# Patient Record
Sex: Male | Born: 1970
Health system: Southern US, Community
[De-identification: ages and names within clinical notes are randomized; demographics above are authoritative.]

## PROBLEM LIST (undated history)

## (undated) DIAGNOSIS — R9431 Abnormal electrocardiogram [ECG] [EKG]: Secondary | ICD-10-CM

## (undated) HISTORY — PX: WISDOM TOOTH EXTRACTION: SHX21

## (undated) HISTORY — PX: HEMORROIDECTOMY: SUR656

---

## 2003-05-15 ENCOUNTER — Emergency Department (HOSPITAL_COMMUNITY): Admission: EM | Admit: 2003-05-15 | Discharge: 2003-05-15 | Payer: Self-pay | Admitting: *Deleted

## 2003-05-26 ENCOUNTER — Observation Stay (HOSPITAL_COMMUNITY): Admission: RE | Admit: 2003-05-26 | Discharge: 2003-05-27 | Payer: Self-pay | Admitting: General Surgery

## 2020-01-09 ENCOUNTER — Emergency Department (HOSPITAL_COMMUNITY): Payer: No Typology Code available for payment source

## 2020-01-09 ENCOUNTER — Inpatient Hospital Stay (HOSPITAL_COMMUNITY)
Admission: EM | Admit: 2020-01-09 | Discharge: 2020-01-14 | DRG: 493 | Disposition: A | Discharge status: Home / Self Care | Payer: No Typology Code available for payment source | Attending: Orthopedic Surgery | Admitting: Orthopedic Surgery

## 2020-01-09 ENCOUNTER — Other Ambulatory Visit: Payer: Self-pay

## 2020-01-09 ENCOUNTER — Encounter (HOSPITAL_COMMUNITY): Payer: Self-pay

## 2020-01-09 DIAGNOSIS — Y92410 Unspecified street and highway as the place of occurrence of the external cause: Secondary | ICD-10-CM

## 2020-01-09 DIAGNOSIS — E8889 Other specified metabolic disorders: Secondary | ICD-10-CM | POA: Diagnosis present

## 2020-01-09 DIAGNOSIS — R9431 Abnormal electrocardiogram [ECG] [EKG]: Secondary | ICD-10-CM | POA: Diagnosis present

## 2020-01-09 DIAGNOSIS — D62 Acute posthemorrhagic anemia: Secondary | ICD-10-CM | POA: Diagnosis not present

## 2020-01-09 DIAGNOSIS — E559 Vitamin D deficiency, unspecified: Secondary | ICD-10-CM | POA: Diagnosis present

## 2020-01-09 DIAGNOSIS — Z833 Family history of diabetes mellitus: Secondary | ICD-10-CM | POA: Diagnosis not present

## 2020-01-09 DIAGNOSIS — Z87891 Personal history of nicotine dependence: Secondary | ICD-10-CM

## 2020-01-09 DIAGNOSIS — S93432A Sprain of tibiofibular ligament of left ankle, initial encounter: Secondary | ICD-10-CM | POA: Diagnosis present

## 2020-01-09 DIAGNOSIS — Z20822 Contact with and (suspected) exposure to covid-19: Secondary | ICD-10-CM | POA: Diagnosis present

## 2020-01-09 DIAGNOSIS — R200 Anesthesia of skin: Secondary | ICD-10-CM | POA: Diagnosis present

## 2020-01-09 DIAGNOSIS — S82872A Displaced pilon fracture of left tibia, initial encounter for closed fracture: Secondary | ICD-10-CM | POA: Diagnosis present

## 2020-01-09 DIAGNOSIS — T148XXA Other injury of unspecified body region, initial encounter: Secondary | ICD-10-CM

## 2020-01-09 DIAGNOSIS — Z419 Encounter for procedure for purposes other than remedying health state, unspecified: Secondary | ICD-10-CM

## 2020-01-09 LAB — BASIC METABOLIC PANEL
Anion gap: 8 (ref 5–15)
BUN: 15 mg/dL (ref 6–20)
CO2: 24 mmol/L (ref 22–32)
Calcium: 9 mg/dL (ref 8.9–10.3)
Chloride: 106 mmol/L (ref 98–111)
Creatinine, Ser: 0.96 mg/dL (ref 0.61–1.24)
GFR calc Af Amer: >60 mL/min (ref >60)
GFR calc non Af Amer: >60 mL/min (ref >60)
Glucose, Bld: 130 mg/dL — ABNORMAL HIGH (ref 70–99)
Potassium: 3.9 mmol/L (ref 3.5–5.1)
Sodium: 138 mmol/L (ref 135–145)

## 2020-01-09 LAB — CBC
HCT: 45.7 % (ref 39.0–52.0)
Hemoglobin: 15.6 g/dL (ref 13.0–17.0)
MCH: 30.2 pg (ref 26.0–34.0)
MCHC: 34.1 g/dL (ref 30.0–36.0)
MCV: 88.6 fL (ref 80.0–100.0)
Platelets: 249 10*3/uL (ref 150–400)
RBC: 5.16 MIL/uL (ref 4.22–5.81)
RDW: 13.2 % (ref 11.5–15.5)
WBC: 19.5 10*3/uL — ABNORMAL HIGH (ref 4.0–10.5)
nRBC: 0 % (ref 0.0–0.2)

## 2020-01-09 LAB — RESPIRATORY PANEL BY RT PCR (FLU A&B, COVID)
Influenza A by PCR: NEGATIVE
Influenza B by PCR: NEGATIVE
SARS Coronavirus 2 by RT PCR: NEGATIVE

## 2020-01-09 LAB — TROPONIN I (HIGH SENSITIVITY)
Troponin I (High Sensitivity): <2 ng/L (ref <18)
Troponin I (High Sensitivity): <2 ng/L (ref <18)

## 2020-01-09 MED ORDER — DOCUSATE SODIUM 100 MG PO CAPS
100.0000 mg | ORAL_CAPSULE | Freq: Two times a day (BID) | ORAL | Status: DC
  Administered 2020-01-10 – 2020-01-14 (×9): 100 mg via ORAL
  Filled 2020-01-09 (×9): qty 1

## 2020-01-09 MED ORDER — FENTANYL CITRATE (PF) 100 MCG/2ML IJ SOLN
100.0000 ug | INTRAMUSCULAR | Status: DC | PRN
  Administered 2020-01-09 (×2): 100 ug via INTRAVENOUS
  Administered 2020-01-10: 10:00:00 50 ug via INTRAVENOUS
  Administered 2020-01-10: 100 ug via INTRAVENOUS
  Administered 2020-01-10 (×3): 50 ug via INTRAVENOUS
  Filled 2020-01-09 (×3): qty 2

## 2020-01-09 MED ORDER — ASCORBIC ACID 500 MG PO TABS
1000.0000 mg | ORAL_TABLET | Freq: Every day | ORAL | Status: DC
  Administered 2020-01-10 – 2020-01-14 (×5): 1000 mg via ORAL
  Filled 2020-01-09 (×5): qty 2

## 2020-01-09 MED ORDER — HYDROMORPHONE HCL 1 MG/ML IJ SOLN: 0.5000 mg | INTRAMUSCULAR | Status: DC | PRN

## 2020-01-09 MED ORDER — POLYETHYLENE GLYCOL 3350 17 G PO PACK
17.0000 g | PACK | Freq: Every day | ORAL | Status: DC
  Administered 2020-01-11 – 2020-01-13 (×2): 17 g via ORAL
  Filled 2020-01-09 (×3): qty 1

## 2020-01-09 MED ORDER — OXYCODONE-ACETAMINOPHEN 7.5-325 MG PO TABS
1.0000 | ORAL_TABLET | Freq: Four times a day (QID) | ORAL | Status: DC | PRN
  Administered 2020-01-10 – 2020-01-14 (×11): 2 via ORAL
  Filled 2020-01-09 (×13): qty 2

## 2020-01-09 MED ORDER — GABAPENTIN 300 MG PO CAPS
300.0000 mg | ORAL_CAPSULE | Freq: Two times a day (BID) | ORAL | Status: DC
  Administered 2020-01-10 (×2): 300 mg via ORAL
  Filled 2020-01-09 (×3): qty 1

## 2020-01-09 MED ORDER — CEFAZOLIN SODIUM-DEXTROSE 2-4 GM/100ML-% IV SOLN
2.0000 g | INTRAVENOUS | Status: AC
  Filled 2020-01-09: qty 100

## 2020-01-09 MED ORDER — LACTATED RINGERS IV SOLN: INTRAVENOUS | Status: DC

## 2020-01-09 MED ORDER — ACETAMINOPHEN 500 MG PO TABS
500.0000 mg | ORAL_TABLET | Freq: Two times a day (BID) | ORAL | Status: DC
  Administered 2020-01-10 – 2020-01-14 (×8): 500 mg via ORAL
  Filled 2020-01-09 (×9): qty 1

## 2020-01-09 MED ORDER — METHOCARBAMOL 500 MG PO TABS
750.0000 mg | ORAL_TABLET | Freq: Three times a day (TID) | ORAL | Status: DC | PRN
  Administered 2020-01-11 – 2020-01-14 (×5): 750 mg via ORAL
  Filled 2020-01-09 (×5): qty 2
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
## 2020-01-10 ENCOUNTER — Inpatient Hospital Stay (HOSPITAL_COMMUNITY): Payer: No Typology Code available for payment source

## 2020-01-10 ENCOUNTER — Encounter (HOSPITAL_COMMUNITY)
Admission: EM | Disposition: A | Discharge status: Home / Self Care | Payer: Self-pay | Source: Home / Self Care | Attending: Orthopedic Surgery

## 2020-01-10 ENCOUNTER — Inpatient Hospital Stay (HOSPITAL_COMMUNITY): Payer: No Typology Code available for payment source | Admitting: Anesthesiology

## 2020-01-10 HISTORY — PX: EXTERNAL FIXATION LEG: SHX1549

## 2020-01-10 LAB — SURGICAL PCR SCREEN
MRSA, PCR: NEGATIVE
Staphylococcus aureus: NEGATIVE

## 2020-01-10 LAB — VITAMIN D 25 HYDROXY (VIT D DEFICIENCY, FRACTURES): Vit D, 25-Hydroxy: 29.81 ng/mL — ABNORMAL LOW (ref 30–100)

## 2020-01-10 SURGERY — EXTERNAL FIXATION, LOWER EXTREMITY
Anesthesia: General | Site: Ankle | Laterality: Left

## 2020-01-10 MED ORDER — ONDANSETRON HCL 4 MG/2ML IJ SOLN
INTRAMUSCULAR | Status: DC | PRN
  Administered 2020-01-10: 4 mg via INTRAVENOUS

## 2020-01-10 MED ORDER — ROCURONIUM BROMIDE 50 MG/5ML IV SOSY
PREFILLED_SYRINGE | INTRAVENOUS | Status: DC | PRN
  Administered 2020-01-10: 30 mg via INTRAVENOUS

## 2020-01-10 MED ORDER — PROPOFOL 10 MG/ML IV BOLUS
INTRAVENOUS | Status: AC
  Filled 2020-01-10: qty 20

## 2020-01-10 MED ORDER — OXYCODONE HCL 5 MG/5ML PO SOLN: 5.0000 mg | Freq: Once | ORAL | Status: DC | PRN

## 2020-01-10 MED ORDER — PROPOFOL 10 MG/ML IV BOLUS
INTRAVENOUS | Status: DC | PRN
  Administered 2020-01-10: 20 mg via INTRAVENOUS
  Administered 2020-01-10: 200 mg via INTRAVENOUS

## 2020-01-10 MED ORDER — FENTANYL CITRATE (PF) 100 MCG/2ML IJ SOLN
INTRAMUSCULAR | Status: AC
  Filled 2020-01-10: qty 2

## 2020-01-10 MED ORDER — OXYCODONE HCL 5 MG PO TABS: 5.0000 mg | ORAL_TABLET | Freq: Once | ORAL | Status: DC | PRN

## 2020-01-10 MED ORDER — PROMETHAZINE HCL 25 MG/ML IJ SOLN: 6.2500 mg | INTRAMUSCULAR | Status: DC | PRN

## 2020-01-10 MED ORDER — PHENYLEPHRINE 40 MCG/ML (10ML) SYRINGE FOR IV PUSH (FOR BLOOD PRESSURE SUPPORT)
PREFILLED_SYRINGE | INTRAVENOUS | Status: AC
  Filled 2020-01-10: qty 10

## 2020-01-10 MED ORDER — 0.9 % SODIUM CHLORIDE (POUR BTL) OPTIME
TOPICAL | Status: DC | PRN
  Administered 2020-01-10: 1000 mL

## 2020-01-10 MED ORDER — FENTANYL CITRATE (PF) 250 MCG/5ML IJ SOLN
INTRAMUSCULAR | Status: AC
  Filled 2020-01-10: qty 5

## 2020-01-10 MED ORDER — MIDAZOLAM HCL 5 MG/5ML IJ SOLN
INTRAMUSCULAR | Status: DC | PRN
  Administered 2020-01-10: 2 mg via INTRAVENOUS

## 2020-01-10 MED ORDER — MIDAZOLAM HCL 2 MG/2ML IJ SOLN
INTRAMUSCULAR | Status: AC
  Filled 2020-01-10: qty 2

## 2020-01-10 MED ORDER — SUGAMMADEX SODIUM 200 MG/2ML IV SOLN
INTRAVENOUS | Status: DC | PRN
  Administered 2020-01-10: 200 mg via INTRAVENOUS

## 2020-01-10 MED ORDER — LACTATED RINGERS IV SOLN: INTRAVENOUS | Status: DC

## 2020-01-10 MED ORDER — FENTANYL CITRATE (PF) 100 MCG/2ML IJ SOLN
25.0000 ug | INTRAMUSCULAR | Status: DC | PRN
  Administered 2020-01-10 (×2): 50 ug via INTRAVENOUS

## 2020-01-10 MED ORDER — DEXAMETHASONE SODIUM PHOSPHATE 10 MG/ML IJ SOLN
INTRAMUSCULAR | Status: DC | PRN
  Administered 2020-01-10: 10 mg via INTRAVENOUS

## 2020-01-10 MED ORDER — CEFAZOLIN SODIUM-DEXTROSE 2-3 GM-%(50ML) IV SOLR
INTRAVENOUS | Status: DC | PRN
  Administered 2020-01-10: 2 g via INTRAVENOUS

## 2020-01-10 MED ORDER — LIDOCAINE 2% (20 MG/ML) 5 ML SYRINGE
INTRAMUSCULAR | Status: DC | PRN
  Administered 2020-01-10: 60 mg via INTRAVENOUS

## 2020-01-10 MED ORDER — HYDROMORPHONE HCL 1 MG/ML IJ SOLN
0.5000 mg | INTRAMUSCULAR | Status: DC | PRN
  Administered 2020-01-10 – 2020-01-13 (×5): 1 mg via INTRAVENOUS
  Filled 2020-01-10 (×5): qty 1

## 2020-01-10 SURGICAL SUPPLY — 58 items
BANDAGE ESMARK 6X9 LF (GAUZE/BANDAGES/DRESSINGS) ×1 IMPLANT
BAR GLASS FIBER EXFX 11X150 (EXFIX) ×6 IMPLANT
BAR GLASS FIBER EXFX 11X350 (EXFIX) ×6 IMPLANT
BNDG COHESIVE 6X5 TAN STRL LF (GAUZE/BANDAGES/DRESSINGS) IMPLANT
BNDG ELASTIC 2X5.8 VLCR STR LF (GAUZE/BANDAGES/DRESSINGS) ×3 IMPLANT
BNDG ELASTIC 3X5.8 VLCR STR LF (GAUZE/BANDAGES/DRESSINGS) ×3 IMPLANT
BNDG ELASTIC 4X5.8 VLCR STR LF (GAUZE/BANDAGES/DRESSINGS) ×3 IMPLANT
BNDG ELASTIC 6X5.8 VLCR STR LF (GAUZE/BANDAGES/DRESSINGS) ×3 IMPLANT
BNDG ESMARK 6X9 LF (GAUZE/BANDAGES/DRESSINGS) ×3
BNDG GAUZE ELAST 4 BULKY (GAUZE/BANDAGES/DRESSINGS) ×6 IMPLANT
BRUSH SCRUB EZ PLAIN DRY (MISCELLANEOUS) ×9 IMPLANT
CAP PROTECTIVE TRANSFX 4.5X5MM (EXFIX) ×6 IMPLANT
CLAMP BLUE BAR TO PIN (EXFIX) ×6 IMPLANT
CLOSURE WOUND 1/2 X4 (GAUZE/BANDAGES/DRESSINGS)
COVER SURGICAL LIGHT HANDLE (MISCELLANEOUS) ×6 IMPLANT
COVER WAND RF STERILE (DRAPES) ×3 IMPLANT
CUFF TOURN SGL QUICK 18X4 (TOURNIQUET CUFF) IMPLANT
DRAPE C-ARM 42X72 X-RAY (DRAPES) IMPLANT
DRAPE C-ARMOR (DRAPES) ×3 IMPLANT
DRAPE U-SHAPE 47X51 STRL (DRAPES) ×3 IMPLANT
DRSG ADAPTIC 3X8 NADH LF (GAUZE/BANDAGES/DRESSINGS) ×3 IMPLANT
DRSG MEPITEL 4X7.2 (GAUZE/BANDAGES/DRESSINGS) IMPLANT
ELECT REM PT RETURN 9FT ADLT (ELECTROSURGICAL) ×3
ELECTRODE REM PT RTRN 9FT ADLT (ELECTROSURGICAL) ×1 IMPLANT
EVACUATOR 1/8 PVC DRAIN (DRAIN) IMPLANT
GAUZE SPONGE 4X4 12PLY STRL (GAUZE/BANDAGES/DRESSINGS) ×3 IMPLANT
GLOVE BIO SURGEON STRL SZ7.5 (GLOVE) ×3 IMPLANT
GLOVE BIO SURGEON STRL SZ8 (GLOVE) ×3 IMPLANT
GLOVE BIOGEL PI IND STRL 7.0 (GLOVE) ×2 IMPLANT
GLOVE BIOGEL PI IND STRL 7.5 (GLOVE) ×1 IMPLANT
GLOVE BIOGEL PI IND STRL 8 (GLOVE) ×1 IMPLANT
GLOVE BIOGEL PI INDICATOR 7.0 (GLOVE) ×4
GLOVE BIOGEL PI INDICATOR 7.5 (GLOVE) ×2
GLOVE BIOGEL PI INDICATOR 8 (GLOVE) ×2
GOWN STRL REUS W/ TWL LRG LVL3 (GOWN DISPOSABLE) ×2 IMPLANT
GOWN STRL REUS W/ TWL XL LVL3 (GOWN DISPOSABLE) ×1 IMPLANT
GOWN STRL REUS W/TWL LRG LVL3 (GOWN DISPOSABLE) ×4
GOWN STRL REUS W/TWL XL LVL3 (GOWN DISPOSABLE) ×2
HALF PIN 3MM (EXFIX) ×3 IMPLANT
KIT BASIN OR (CUSTOM PROCEDURE TRAY) ×3 IMPLANT
KIT TURNOVER KIT B (KITS) ×3 IMPLANT
MANIFOLD NEPTUNE II (INSTRUMENTS) IMPLANT
NEEDLE 22X1 1/2 (OR ONLY) (NEEDLE) IMPLANT
NS IRRIG 1000ML POUR BTL (IV SOLUTION) ×3 IMPLANT
PACK ORTHO EXTREMITY (CUSTOM PROCEDURE TRAY) ×3 IMPLANT
PAD ARMBOARD 7.5X6 YLW CONV (MISCELLANEOUS) ×6 IMPLANT
PADDING CAST COTTON 6X4 STRL (CAST SUPPLIES) ×6 IMPLANT
PIN 3MM (EXFIX) ×3 IMPLANT
PIN CLAMP 2BAR 75MM BLUE (EXFIX) ×3 IMPLANT
PIN HALF YELLOW 5X160X35 (EXFIX) ×6 IMPLANT
PIN TRANSFIXING 5.0 (EXFIX) ×3 IMPLANT
SPONGE LAP 18X18 RF (DISPOSABLE) ×3 IMPLANT
STAPLER VISISTAT 35W (STAPLE) IMPLANT
STOCKINETTE IMPERVIOUS LG (DRAPES) IMPLANT
STRIP CLOSURE SKIN 1/2X4 (GAUZE/BANDAGES/DRESSINGS) IMPLANT
TOWEL GREEN STERILE (TOWEL DISPOSABLE) ×6 IMPLANT
TOWEL GREEN STERILE FF (TOWEL DISPOSABLE) ×6 IMPLANT
UNDERPAD 30X30 (UNDERPADS AND DIAPERS) ×3 IMPLANT

## 2020-01-10 NOTE — Progress Notes (Signed)
PT Cancellation Note  Patient Details Name: STANLEE ROEHRIG MRN: [id number] DOB: 1971/03/08   Cancelled Treatment:    Reason Eval/Treat Not Completed: Patient at procedure or test/unavailable (plan for ex fix).    Lillia Pauls, PT, DPT Acute Rehabilitation Services Pager [phone] Office [phone]    [name] 01/10/2020, 8:19 AM

## 2020-01-10 NOTE — Transfer of Care (Signed)
Immediate Anesthesia Transfer of Care Note  Patient: Priscille Kluver  Procedure(s) Performed: EXTERNAL FIXATION, ANKLE (Left Ankle)  Patient Location: PACU  Anesthesia Type:General  Level of Consciousness: awake, alert  and sedated  Airway & Oxygen Therapy: Patient connected to face mask oxygen  Post-op Assessment: Post -op Vital signs reviewed and stable  Post vital signs: stable  Last Vitals:  Vitals Value Taken Time  BP 130/84 01/10/20 1103  Temp    Pulse 80 01/10/20 1103  Resp 11 01/10/20 1103  SpO2 98 % 01/10/20 1103  Vitals shown include unvalidated device data.  Last Pain:  Vitals:   01/10/20 0803  TempSrc: Oral  PainSc:       Patients Stated Pain Goal: 2 (01/10/20 0130)  Complications: No apparent anesthesia complications

## 2020-01-10 NOTE — Anesthesia Procedure Notes (Signed)
Procedure Name: LMA Insertion Date/Time: 01/10/2020 9:45 AM Performed by: Dorie Rank, CRNA Pre-anesthesia Checklist: Patient identified, Emergency Drugs available, Suction available, Patient being monitored and Timeout performed Patient Re-evaluated:Patient Re-evaluated prior to induction Oxygen Delivery Method: Circle system utilized Preoxygenation: Pre-oxygenation with 100% oxygen Induction Type: IV induction Ventilation: Mask ventilation without difficulty LMA: LMA inserted LMA Size: 4.0 Number of attempts: 1 Placement Confirmation: breath sounds checked- equal and bilateral and positive ETCO2 Tube secured with: Tape Dental Injury: Teeth and Oropharynx as per pre-operative assessment

## 2020-01-10 NOTE — Progress Notes (Signed)
OT Cancellation Note  Patient Details Name: Robert Hogan MRN: [id number] DOB: 1971-07-11   Cancelled Treatment:    Reason Eval/Treat Not Completed: Patient at procedure or test/ unavailable; noted plans to OR for ex-fix today. Will follow up for OT eval as able.  Marcy Siren, OT Acute Rehabilitation Services Pager [phone] Office [phone]   Orlando Penner 01/10/2020, 8:20 AM

## 2020-01-10 NOTE — Anesthesia Postprocedure Evaluation (Signed)
Anesthesia Post Note  Patient: Robert Hogan  Procedure(s) Performed: EXTERNAL FIXATION, ANKLE (Left Ankle)     Patient location during evaluation: PACU Anesthesia Type: General Level of consciousness: awake and alert Pain management: pain level controlled Vital Signs Assessment: post-procedure vital signs reviewed and stable Respiratory status: spontaneous breathing, nonlabored ventilation and respiratory function stable Cardiovascular status: blood pressure returned to baseline and stable Postop Assessment: no apparent nausea or vomiting Anesthetic complications: no    Last Vitals:  Vitals:   01/10/20 1130 01/10/20 1155  BP: 127/80 (!) 150/98  Pulse: 78 86  Resp: (!) 8 18  Temp: 36.7 C 36.9 C  SpO2: 100% 100%    Last Pain:  Vitals:   01/10/20 1155  TempSrc: Oral  PainSc:                  Beryle Lathe
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
## 2020-01-10 NOTE — Anesthesia Preprocedure Evaluation (Addendum)
Anesthesia Evaluation  Patient identified by MRN, date of birth, ID band Patient awake    Reviewed: Allergy & Precautions, NPO status , Patient's Chart, lab work & pertinent test results  History of Anesthesia Complications Negative for: history of anesthetic complications  Airway Mallampati: III  TM Distance: >3 FB Neck ROM: Full    Dental  (+) Dental Advisory Given, Teeth Intact   Pulmonary former smoker,    Pulmonary exam normal        Cardiovascular Normal cardiovascular exam   EKG - SR, LAFB, prolonged QTc    Neuro/Psych negative neurological ROS  negative psych ROS   GI/Hepatic negative GI ROS, Neg liver ROS,   Endo/Other  negative endocrine ROS  Renal/GU negative Renal ROS     Musculoskeletal negative musculoskeletal ROS (+)   Abdominal   Peds  Hematology negative hematology ROS (+)   Anesthesia Other Findings   Reproductive/Obstetrics                           Anesthesia Physical Anesthesia Plan  ASA: I  Anesthesia Plan: General   Post-op Pain Management:    Induction: Intravenous  PONV Risk Score and Plan: 3 and Treatment may vary due to age or medical condition, Ondansetron, Midazolam and Dexamethasone  Airway Management Planned: LMA  Additional Equipment: None  Intra-op Plan:   Post-operative Plan: Extubation in OR  Informed Consent: I have reviewed the patients History and Physical, chart, labs and discussed the procedure including the risks, benefits and alternatives for the proposed anesthesia with the patient or authorized representative who has indicated his/her understanding and acceptance.     Dental advisory given  Plan Discussed with: CRNA and Anesthesiologist  Anesthesia Plan Comments:        Anesthesia Quick Evaluation
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
## 2020-01-10 NOTE — Progress Notes (Signed)
Orthopedic Tech Progress Note Patient Details:  Robert Hogan [birth year] [id number]  Ortho Devices Type of Ortho Device: Post (short leg) splint, Stirrup splint Ortho Device/Splint Location: lle. dr handy assisted. Ortho Device/Splint Interventions: Ordered, Application, Adjustment   Post Interventions Patient Tolerated: Well Instructions Provided: Care of device, Adjustment of device   [name] Post 01/10/2020, 3:50 AM

## 2020-01-11 LAB — HIV ANTIBODY (ROUTINE TESTING W REFLEX): HIV Screen 4th Generation wRfx: NONREACTIVE

## 2020-01-11 MED ORDER — KETOROLAC TROMETHAMINE 15 MG/ML IJ SOLN
15.0000 mg | Freq: Three times a day (TID) | INTRAMUSCULAR | Status: DC
  Administered 2020-01-11 – 2020-01-14 (×9): 15 mg via INTRAVENOUS
  Filled 2020-01-11 (×10): qty 1

## 2020-01-11 MED ORDER — VITAMIN D 25 MCG (1000 UNIT) PO TABS
2000.0000 [IU] | ORAL_TABLET | Freq: Two times a day (BID) | ORAL | Status: DC
  Administered 2020-01-11 – 2020-01-14 (×7): 2000 [IU] via ORAL
  Filled 2020-01-11 (×7): qty 2

## 2020-01-11 MED ORDER — GABAPENTIN 300 MG PO CAPS
300.0000 mg | ORAL_CAPSULE | Freq: Three times a day (TID) | ORAL | Status: DC
  Administered 2020-01-11 – 2020-01-14 (×10): 300 mg via ORAL
  Filled 2020-01-11 (×10): qty 1

## 2020-01-11 MED ORDER — ENOXAPARIN SODIUM 40 MG/0.4ML ~~LOC~~ SOLN
40.0000 mg | SUBCUTANEOUS | Status: DC
  Administered 2020-01-11 – 2020-01-14 (×4): 40 mg via SUBCUTANEOUS
  Filled 2020-01-11 (×4): qty 0.4

## 2020-01-11 NOTE — Progress Notes (Signed)
Patient  Was able to be wheeled in to room 1 to spend time with his wife
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
## 2020-01-11 NOTE — Plan of Care (Signed)

## 2020-01-11 NOTE — Progress Notes (Signed)
Orthopedic Tech Progress Note Patient Details:  Robert Hogan 1970/09/22 [id number]  Ortho Devices Type of Ortho Device: Crutches Ortho Device/Splint Location: bedside Ortho Device/Splint Interventions: Ordered, Adjustment   Post Interventions Patient Tolerated: Well Instructions Provided: Care of device, Adjustment of device   [name] 01/11/2020, 12:59 PM
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
## 2020-01-11 NOTE — Plan of Care (Signed)
  Problem: Education: Goal: Knowledge of General Education information will improve Description Including pain rating scale, medication(s)/side effects and non-pharmacologic comfort measures Outcome: Progressing   

## 2020-01-12 ENCOUNTER — Encounter (HOSPITAL_COMMUNITY): Payer: Self-pay | Admitting: Orthopedic Surgery

## 2020-01-12 DIAGNOSIS — S82872A Displaced pilon fracture of left tibia, initial encounter for closed fracture: Secondary | ICD-10-CM

## 2020-01-12 HISTORY — DX: Displaced pilon fracture of left tibia, initial encounter for closed fracture: S82.872A
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
## 2020-01-12 NOTE — Plan of Care (Signed)
  Problem: Education: Goal: Knowledge of General Education information will improve Description: Including pain rating scale, medication(s)/side effects and non-pharmacologic comfort measures Outcome: Progressing   Problem: Health Behavior/Discharge Planning: Goal: Ability to manage health-related needs will improve Outcome: Progressing   Problem: Activity: Goal: Risk for activity intolerance will decrease Outcome: Progressing   Problem: Nutrition: Goal: Adequate nutrition will be maintained Outcome: Progressing   Problem: Elimination: Goal: Will not experience complications related to bowel motility Outcome: Progressing Goal: Will not experience complications related to urinary retention Outcome: Progressing   

## 2020-01-13 ENCOUNTER — Encounter: Payer: Self-pay | Admitting: *Deleted

## 2020-01-13 LAB — CBC
HCT: 39.6 % (ref 39.0–52.0)
Hemoglobin: 13.2 g/dL (ref 13.0–17.0)
MCH: 30.4 pg (ref 26.0–34.0)
MCHC: 33.3 g/dL (ref 30.0–36.0)
MCV: 91.2 fL (ref 80.0–100.0)
Platelets: 227 10*3/uL (ref 150–400)
RBC: 4.34 MIL/uL (ref 4.22–5.81)
RDW: 13.1 % (ref 11.5–15.5)
WBC: 9.2 10*3/uL (ref 4.0–10.5)
nRBC: 0 % (ref 0.0–0.2)
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
## 2020-01-13 NOTE — Plan of Care (Signed)
  Problem: Pain Managment: Goal: General experience of comfort will improve Outcome: Progressing   

## 2020-01-14 ENCOUNTER — Encounter (HOSPITAL_COMMUNITY): Payer: Self-pay | Admitting: Orthopedic Surgery

## 2020-01-14 DIAGNOSIS — E559 Vitamin D deficiency, unspecified: Secondary | ICD-10-CM

## 2020-01-14 HISTORY — DX: Vitamin D deficiency, unspecified: E55.9

## 2020-01-14 MED ORDER — DOCUSATE SODIUM 100 MG PO CAPS: 100.0000 mg | ORAL_CAPSULE | Freq: Two times a day (BID) | ORAL | 0 refills | Status: DC

## 2020-01-14 MED ORDER — GABAPENTIN 300 MG PO CAPS: 300.0000 mg | ORAL_CAPSULE | Freq: Three times a day (TID) | ORAL | 0 refills | Status: AC

## 2020-01-14 MED ORDER — TRAMADOL HCL 50 MG PO TABS: 50.0000 mg | ORAL_TABLET | Freq: Three times a day (TID) | ORAL | 0 refills | Status: DC | PRN

## 2020-01-14 MED ORDER — ACETAMINOPHEN 500 MG PO TABS: 500.0000 mg | ORAL_TABLET | Freq: Two times a day (BID) | ORAL | 0 refills | Status: AC

## 2020-01-14 MED ORDER — ASCORBIC ACID 1000 MG PO TABS: 1000.0000 mg | ORAL_TABLET | Freq: Every day | ORAL | 1 refills | Status: AC

## 2020-01-14 MED ORDER — OXYCODONE-ACETAMINOPHEN 5-325 MG PO TABS: 1.0000 | ORAL_TABLET | Freq: Four times a day (QID) | ORAL | 0 refills | Status: DC | PRN

## 2020-01-14 MED ORDER — VITAMIN D3 25 MCG PO TABS: 2000.0000 [IU] | ORAL_TABLET | Freq: Two times a day (BID) | ORAL | 3 refills | Status: DC

## 2020-01-14 MED ORDER — ENOXAPARIN SODIUM 40 MG/0.4ML ~~LOC~~ SOLN: 40.0000 mg | SUBCUTANEOUS | 0 refills | Status: DC

## 2020-01-14 MED ORDER — METHOCARBAMOL 500 MG PO TABS: 500.0000 mg | ORAL_TABLET | Freq: Three times a day (TID) | ORAL | 0 refills | Status: DC | PRN

## 2020-01-14 MED FILL — GABAPENTIN 300 MG CAPSULE: 300 | 21 days supply | Qty: 63 | Fill #0

## 2020-01-14 MED FILL — ENOXAPARIN SODIUM 40 MG/0.4: 40 | 28 days supply | Qty: 11 | Fill #0

## 2020-01-14 MED FILL — DOK 100 MG CAPS: 100 | 10 days supply | Qty: 20 | Fill #0

## 2020-01-14 MED FILL — ACETAMINOPHEN 500MG XT STRE: 500 | 30 days supply | Qty: 60 | Fill #0

## 2020-01-14 MED FILL — traMADol HCL 50 MG TABS: 50 | 10 days supply | Qty: 30 | Fill #0

## 2020-01-14 MED FILL — OXYCODONE-APAP 5-325MG: 5-325 | 6 days supply | Qty: 50 | Fill #0

## 2020-01-14 MED FILL — METHOCARBAMOL 500 MG TABS: 500 | 10 days supply | Qty: 60 | Fill #0

## 2020-01-14 MED FILL — VITAMIN D3 25 MCG TABS: 25 | 30 days supply | Qty: 120 | Fill #0

## 2020-01-14 MED FILL — VITAMIN C 500 MG TABLET: 500 | 30 days supply | Qty: 60 | Fill #0

## 2020-01-14 NOTE — Plan of Care (Signed)
  Problem: Pain Managment: Goal: General experience of comfort will improve Outcome: Progressing   
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
## 2020-01-14 NOTE — Progress Notes (Signed)
Discharge information and packet given to pt. Pt not in distress and tolerated well. 
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
## 2020-01-14 NOTE — Discharge Instructions (Signed)
Orthopaedic Trauma Service Discharge Instructions   General Discharge Instructions  Orthopaedic Injuries:  Left distal tibia fracture treated with external fixation   WEIGHT BEARING STATUS: Nonweighbearing Left lower extremity, use walker   RANGE OF MOTION/ACTIVITY: ok to move toes and knee, activity as tolerated while maintaining weightbearing restrictions   Bone health: your labs show some mild vitamin d insuffiencey. Take vitamin d and vitamin c    Wound Care: daily wound/pin care as noted below     Discharge Pin Site Instructions  Dress pins daily with Kerlix roll starting on POD 2. Wrap the Kerlix so that it tamps the skin down around the pin-skin interface to prevent/limit motion of the skin relative to the pin.  (Pin-skin motion is the primary cause of pain and infection related to external fixator pin sites).  Remove any crust or coagulum that may obstruct drainage with soap and water.  After POD 3, if there is no discernable drainage on the pin site dressing, the interval for change can by increased to every other day.  You may shower with the fixator, cleaning all pin sites gently with soap and water.  If you have a surgical wound this needs to be completely dry and without drainage before showering. Alternatively you can use a washcloth with soap and water and gently clean the injured extremity and external fixator, including all pinsites and surgical wounds   The extremity can be lifted by the fixator to facilitate wound care and transfers.  Notify the office/Doctor if you experience increasing drainage, redness, or pain from a pin site, or if you notice purulent (thick, snot-like) drainage.  DVT/PE prophylaxis: Lovenox 40 mg subcutaneous injection daily x 28 days   Diet: as you were eating previously.  Can use over the counter stool softeners and bowel preparations, such as Miralax, to help with bowel movements.  Narcotics can be constipating.  Be sure to drink  plenty of fluids  PAIN MEDICATION USE AND EXPECTATIONS  You have likely been given narcotic medications to help control your pain.  After a traumatic event that results in an fracture (broken bone) with or without surgery, it is ok to use narcotic pain medications to help control one's pain.  We understand that everyone responds to pain differently and each individual patient will be evaluated on a regular basis for the continued need for narcotic medications. Ideally, narcotic medication use should last no more than 6-8 weeks (coinciding with fracture healing).   As a patient it is your responsibility as well to monitor narcotic medication use and report the amount and frequency you use these medications when you come to your office visit.   We would also advise that if you are using narcotic medications, you should take a dose prior to therapy to maximize you participation.  IF YOU ARE ON NARCOTIC MEDICATIONS IT IS NOT PERMISSIBLE TO OPERATE A MOTOR VEHICLE (MOTORCYCLE/CAR/TRUCK/MOPED) OR HEAVY MACHINERY DO NOT MIX NARCOTICS WITH OTHER CNS (CENTRAL NERVOUS SYSTEM) DEPRESSANTS SUCH AS ALCOHOL   STOP SMOKING OR USING NICOTINE PRODUCTS!!!!  As discussed nicotine severely impairs your body's ability to heal surgical and traumatic wounds but also impairs bone healing.  Wounds and bone heal by forming microscopic blood vessels (angiogenesis) and nicotine is a vasoconstrictor (essentially, shrinks blood vessels).  Therefore, if vasoconstriction occurs to these microscopic blood vessels they essentially disappear and are unable to deliver necessary nutrients to the healing tissue.  This is one modifiable factor that you can do to dramatically increase  your chances of healing your injury.    (This means no smoking, no nicotine gum, patches, etc)  DO NOT USE NONSTEROIDAL ANTI-INFLAMMATORY DRUGS (NSAID'S)  Using products such as Advil (ibuprofen), Aleve (naproxen), Motrin (ibuprofen) for additional pain  control during fracture healing can delay and/or prevent the healing response.  If you would like to take over the counter (OTC) medication, Tylenol (acetaminophen) is ok.  However, some narcotic medications that are given for pain control contain acetaminophen as well. Therefore, you should not exceed more than 4000 mg of tylenol in a day if you do not have liver disease.  Also note that there are may OTC medicines, such as cold medicines and allergy medicines that my contain tylenol as well.  If you have any questions about medications and/or interactions please ask your doctor/PA or your pharmacist.      ICE AND ELEVATE INJURED/OPERATIVE EXTREMITY  Using ice and elevating the injured extremity above your heart can help with swelling and pain control.  Icing in a pulsatile fashion, such as 20 minutes on and 20 minutes off, can be followed.    Do not place ice directly on skin. Make sure there is a barrier between to skin and the ice pack.    Using frozen items such as frozen peas works well as the conform nicely to the are that needs to be iced.  USE AN ACE WRAP OR TED HOSE FOR SWELLING CONTROL  In addition to icing and elevation, Ace wraps or TED hose are used to help limit and resolve swelling.  It is recommended to use Ace wraps or TED hose until you are informed to stop.    When using Ace Wraps start the wrapping distally (farthest away from the body) and wrap proximally (closer to the body)   Example: If you had surgery on your leg or thing and you do not have a splint on, start the ace wrap at the toes and work your way up to the thigh        If you had surgery on your upper extremity and do not have a splint on, start the ace wrap at your fingers and work your way up to the upper arm  IF YOU ARE IN A SPLINT OR CAST DO NOT [place]   If your splint gets wet for any reason please contact the office immediately. You may shower in your splint or cast as long as you keep it dry.   This can be done by wrapping in a cast cover or garbage back (or similar)  Do Not stick any thing down your splint or cast such as pencils, money, or hangers to try and scratch yourself with.  If you feel itchy take benadryl as prescribed on the bottle for itching  IF YOU ARE IN A CAM BOOT (BLACK BOOT)  You may remove boot periodically. Perform daily dressing changes as noted below.  Wash the liner of the boot regularly and wear a sock when wearing the boot. It is recommended that you sleep in the boot until told otherwise    Call office for the following:  Temperature greater than 101F  Persistent nausea and vomiting  Severe uncontrolled pain  Redness, tenderness, or signs of infection (pain, swelling, redness, odor or green/yellow discharge around the site)  Difficulty breathing, headache or visual disturbances  Hives  Persistent dizziness or light-headedness  Extreme fatigue  Any other questions or concerns you may have after discharge  In  an emergency, call 911 or go to an Emergency Department at a nearby hospital    CALL THE OFFICE WITH ANY QUESTIONS OR CONCERNS: [phone]   VISIT OUR WEBSITE FOR ADDITIONAL INFORMATION: [email or web address]

## 2020-01-23 ENCOUNTER — Other Ambulatory Visit: Payer: Self-pay

## 2020-01-23 ENCOUNTER — Encounter (HOSPITAL_COMMUNITY): Payer: Self-pay | Admitting: Orthopedic Surgery

## 2020-01-23 NOTE — Progress Notes (Signed)
Patient denies chest pain, shob, cardiology visit, or cardiac test. Reports no PCP. States he has never been told of abnormal EKG until coming into the hospital. Denies COVID symptoms or being around anyone sick. Educated patient on visitation policy.  When I asked about him coming to get COVID test on Monday he stated he is barely able to make it to the bathroom due to external fixator and that it takes two people to assist him getting into car. I advised him we would test him DOS for COVID arrival time adjusted to allow for COVID test DOS. I advised once external fixator is off, his mobility improved, and if he were to need additional surgeries we would need to get COVID test before DOS. Will send chart to anesthesia to review EKG.

## 2020-01-25 NOTE — Anesthesia Preprocedure Evaluation (Deleted)
Anesthesia Evaluation    Airway        Dental   Pulmonary former smoker,           Cardiovascular      Neuro/Psych    GI/Hepatic   Endo/Other    Renal/GU      Musculoskeletal   Abdominal   Peds  Hematology   Anesthesia Other Findings   Reproductive/Obstetrics                             Anesthesia Physical Anesthesia Plan  ASA:   Anesthesia Plan:    Post-op Pain Management:    Induction:   PONV Risk Score and Plan:   Airway Management Planned:   Additional Equipment:   Intra-op Plan:   Post-operative Plan:   Informed Consent:   Plan Discussed with:   Anesthesia Plan Comments: (01/09/20 EKG reviewed with cardiology.  He is for repeat EKG on the day of surgery. See PAT note written 01/25/2020 by Shonna Chock, PA-C.  )        Anesthesia Quick Evaluation
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
## 2020-01-26 ENCOUNTER — Encounter (HOSPITAL_COMMUNITY): Payer: Self-pay | Admitting: Orthopedic Surgery

## 2020-01-26 ENCOUNTER — Ambulatory Visit (HOSPITAL_COMMUNITY): Payer: Self-pay

## 2020-01-26 ENCOUNTER — Other Ambulatory Visit: Payer: Self-pay

## 2020-01-26 ENCOUNTER — Ambulatory Visit (HOSPITAL_COMMUNITY)
Admission: RE | Admit: 2020-01-26 | Discharge: 2020-01-26 | Disposition: A | Discharge status: Home / Self Care | Payer: Self-pay | Attending: Orthopedic Surgery | Admitting: Orthopedic Surgery

## 2020-01-26 ENCOUNTER — Ambulatory Visit (HOSPITAL_COMMUNITY): Payer: Self-pay | Admitting: Vascular Surgery

## 2020-01-26 ENCOUNTER — Encounter (HOSPITAL_COMMUNITY)
Admission: RE | Disposition: A | Discharge status: Home / Self Care | Payer: Self-pay | Source: Home / Self Care | Attending: Orthopedic Surgery

## 2020-01-26 DIAGNOSIS — S82872A Displaced pilon fracture of left tibia, initial encounter for closed fracture: Secondary | ICD-10-CM | POA: Insufficient documentation

## 2020-01-26 DIAGNOSIS — Z20822 Contact with and (suspected) exposure to covid-19: Secondary | ICD-10-CM | POA: Insufficient documentation

## 2020-01-26 DIAGNOSIS — Z419 Encounter for procedure for purposes other than remedying health state, unspecified: Secondary | ICD-10-CM

## 2020-01-26 DIAGNOSIS — Z87891 Personal history of nicotine dependence: Secondary | ICD-10-CM | POA: Insufficient documentation

## 2020-01-26 DIAGNOSIS — X58XXXA Exposure to other specified factors, initial encounter: Secondary | ICD-10-CM | POA: Insufficient documentation

## 2020-01-26 DIAGNOSIS — Z79899 Other long term (current) drug therapy: Secondary | ICD-10-CM | POA: Insufficient documentation

## 2020-01-26 DIAGNOSIS — I4510 Unspecified right bundle-branch block: Secondary | ICD-10-CM | POA: Insufficient documentation

## 2020-01-26 DIAGNOSIS — Z7901 Long term (current) use of anticoagulants: Secondary | ICD-10-CM | POA: Insufficient documentation

## 2020-01-26 HISTORY — PX: ORIF ANKLE FRACTURE: SHX5408

## 2020-01-26 HISTORY — DX: Abnormal electrocardiogram (ECG) (EKG): R94.31

## 2020-01-26 LAB — COMPREHENSIVE METABOLIC PANEL
ALT: 40 U/L (ref 0–44)
AST: 24 U/L (ref 15–41)
Albumin: 4.1 g/dL (ref 3.5–5.0)
Alkaline Phosphatase: 121 U/L (ref 38–126)
Anion gap: 12 (ref 5–15)
BUN: 17 mg/dL (ref 6–20)
CO2: 25 mmol/L (ref 22–32)
Calcium: 9.6 mg/dL (ref 8.9–10.3)
Chloride: 101 mmol/L (ref 98–111)
Creatinine, Ser: 0.95 mg/dL (ref 0.61–1.24)
GFR calc Af Amer: >60 mL/min (ref >60)
GFR calc non Af Amer: >60 mL/min (ref >60)
Glucose, Bld: 102 mg/dL — ABNORMAL HIGH (ref 70–99)
Potassium: 4 mmol/L (ref 3.5–5.1)
Sodium: 138 mmol/L (ref 135–145)
Total Bilirubin: 0.8 mg/dL (ref 0.3–1.2)
Total Protein: 8.3 g/dL — ABNORMAL HIGH (ref 6.5–8.1)

## 2020-01-26 LAB — CBC WITH DIFFERENTIAL/PLATELET
Abs Immature Granulocytes: 0.05 10*3/uL (ref 0.00–0.07)
Basophils Absolute: 0.1 10*3/uL (ref 0.0–0.1)
Basophils Relative: 1 %
Eosinophils Absolute: 0.2 10*3/uL (ref 0.0–0.5)
Eosinophils Relative: 2 %
HCT: 44.5 % (ref 39.0–52.0)
Hemoglobin: 14.9 g/dL (ref 13.0–17.0)
Immature Granulocytes: 1 %
Lymphocytes Relative: 25 %
Lymphs Abs: 2 10*3/uL (ref 0.7–4.0)
MCH: 30.3 pg (ref 26.0–34.0)
MCHC: 33.5 g/dL (ref 30.0–36.0)
MCV: 90.6 fL (ref 80.0–100.0)
Monocytes Absolute: 0.6 10*3/uL (ref 0.1–1.0)
Monocytes Relative: 7 %
Neutro Abs: 5.2 10*3/uL (ref 1.7–7.7)
Neutrophils Relative %: 64 %
Platelets: 510 10*3/uL — ABNORMAL HIGH (ref 150–400)
RBC: 4.91 MIL/uL (ref 4.22–5.81)
RDW: 12.6 % (ref 11.5–15.5)
WBC: 8.2 10*3/uL (ref 4.0–10.5)
nRBC: 0 % (ref 0.0–0.2)

## 2020-01-26 LAB — APTT: aPTT: 34 seconds (ref 24–36)

## 2020-01-26 LAB — PROTIME-INR
INR: 1 (ref 0.8–1.2)
Prothrombin Time: 12.4 seconds (ref 11.4–15.2)

## 2020-01-26 LAB — SARS CORONAVIRUS 2 BY RT PCR (HOSPITAL ORDER, PERFORMED IN ~~LOC~~ HOSPITAL LAB): SARS Coronavirus 2: NEGATIVE

## 2020-01-26 SURGERY — OPEN REDUCTION INTERNAL FIXATION (ORIF) ANKLE FRACTURE
Anesthesia: General | Site: Ankle | Laterality: Left

## 2020-01-26 MED ORDER — PROPOFOL 10 MG/ML IV BOLUS
INTRAVENOUS | Status: AC
  Filled 2020-01-26: qty 40

## 2020-01-26 MED ORDER — LACTATED RINGERS IV SOLN: INTRAVENOUS | Status: DC

## 2020-01-26 MED ORDER — MIDAZOLAM HCL 2 MG/2ML IJ SOLN
INTRAMUSCULAR | Status: AC
  Administered 2020-01-26: 2 mg
  Filled 2020-01-26: qty 2

## 2020-01-26 MED ORDER — ROPIVACAINE HCL 5 MG/ML IJ SOLN
INTRAMUSCULAR | Status: DC | PRN
  Administered 2020-01-26: 15 mL via PERINEURAL
  Administered 2020-01-26: 25 mL via PERINEURAL

## 2020-01-26 MED ORDER — DEXAMETHASONE SODIUM PHOSPHATE 10 MG/ML IJ SOLN
INTRAMUSCULAR | Status: DC | PRN
  Administered 2020-01-26: 5 mg via INTRAVENOUS

## 2020-01-26 MED ORDER — FENTANYL CITRATE (PF) 100 MCG/2ML IJ SOLN
25.0000 ug | INTRAMUSCULAR | Status: DC | PRN
  Administered 2020-01-26: 25 ug via INTRAVENOUS
  Administered 2020-01-26: 50 ug via INTRAVENOUS
  Administered 2020-01-26: 25 ug via INTRAVENOUS

## 2020-01-26 MED ORDER — FENTANYL CITRATE (PF) 100 MCG/2ML IJ SOLN: 100.0000 ug | Freq: Once | INTRAMUSCULAR | Status: DC

## 2020-01-26 MED ORDER — ROCURONIUM BROMIDE 10 MG/ML (PF) SYRINGE
PREFILLED_SYRINGE | INTRAVENOUS | Status: AC
  Filled 2020-01-26: qty 10

## 2020-01-26 MED ORDER — MIDAZOLAM HCL 2 MG/2ML IJ SOLN: 2.0000 mg | Freq: Once | INTRAMUSCULAR | Status: DC

## 2020-01-26 MED ORDER — CHLORHEXIDINE GLUCONATE 4 % EX LIQD: 60.0000 mL | Freq: Once | CUTANEOUS | Status: DC

## 2020-01-26 MED ORDER — DEXAMETHASONE SODIUM PHOSPHATE 10 MG/ML IJ SOLN
INTRAMUSCULAR | Status: DC | PRN
  Administered 2020-01-26: 10 mg

## 2020-01-26 MED ORDER — METHOCARBAMOL 500 MG PO TABS: 500.0000 mg | ORAL_TABLET | Freq: Three times a day (TID) | ORAL | 0 refills | Status: DC | PRN

## 2020-01-26 MED ORDER — PHENYLEPHRINE HCL-NACL 10-0.9 MG/250ML-% IV SOLN
INTRAVENOUS | Status: DC | PRN
  Administered 2020-01-26: 25 ug/min via INTRAVENOUS

## 2020-01-26 MED ORDER — CELECOXIB 200 MG PO CAPS
200.0000 mg | ORAL_CAPSULE | Freq: Once | ORAL | Status: AC
  Administered 2020-01-26: 200 mg via ORAL
  Filled 2020-01-26: qty 1

## 2020-01-26 MED ORDER — LIDOCAINE 2% (20 MG/ML) 5 ML SYRINGE
INTRAMUSCULAR | Status: AC
  Filled 2020-01-26: qty 5

## 2020-01-26 MED ORDER — EPHEDRINE 5 MG/ML INJ
INTRAVENOUS | Status: AC
  Filled 2020-01-26: qty 10

## 2020-01-26 MED ORDER — METHOCARBAMOL 500 MG PO TABS
1000.0000 mg | ORAL_TABLET | Freq: Once | ORAL | Status: AC
  Administered 2020-01-26: 1000 mg via ORAL

## 2020-01-26 MED ORDER — TRAMADOL HCL 50 MG PO TABS
ORAL_TABLET | ORAL | Status: AC
  Filled 2020-01-26: qty 1

## 2020-01-26 MED ORDER — CEFAZOLIN SODIUM-DEXTROSE 2-3 GM-%(50ML) IV SOLR
INTRAVENOUS | Status: DC | PRN
Start: 2020-01-26 — End: 2020-01-26
  Administered 2020-01-26: 2 g via INTRAVENOUS

## 2020-01-26 MED ORDER — FENTANYL CITRATE (PF) 100 MCG/2ML IJ SOLN
INTRAMUSCULAR | Status: AC
  Filled 2020-01-26: qty 2

## 2020-01-26 MED ORDER — MIDAZOLAM HCL 2 MG/2ML IJ SOLN
INTRAMUSCULAR | Status: AC
  Filled 2020-01-26: qty 2

## 2020-01-26 MED ORDER — TRAMADOL HCL 50 MG PO TABS
50.0000 mg | ORAL_TABLET | Freq: Once | ORAL | Status: AC
  Administered 2020-01-26: 50 mg via ORAL

## 2020-01-26 MED ORDER — PROMETHAZINE HCL 25 MG/ML IJ SOLN: 6.2500 mg | INTRAMUSCULAR | Status: DC | PRN

## 2020-01-26 MED ORDER — 0.9 % SODIUM CHLORIDE (POUR BTL) OPTIME
TOPICAL | Status: DC | PRN
  Administered 2020-01-26: 1000 mL

## 2020-01-26 MED ORDER — FENTANYL CITRATE (PF) 100 MCG/2ML IJ SOLN
INTRAMUSCULAR | Status: AC
  Administered 2020-01-26: 100 ug
  Filled 2020-01-26: qty 2

## 2020-01-26 MED ORDER — FENTANYL CITRATE (PF) 100 MCG/2ML IJ SOLN
INTRAMUSCULAR | Status: DC | PRN
  Administered 2020-01-26: 50 ug via INTRAVENOUS

## 2020-01-26 MED ORDER — PROPOFOL 10 MG/ML IV BOLUS
INTRAVENOUS | Status: DC | PRN
  Administered 2020-01-26: 200 mg via INTRAVENOUS

## 2020-01-26 MED ORDER — ACETAMINOPHEN 500 MG PO TABS
1000.0000 mg | ORAL_TABLET | Freq: Once | ORAL | Status: AC
  Administered 2020-01-26: 10:00:00 1000 mg via ORAL
  Filled 2020-01-26: qty 2

## 2020-01-26 MED ORDER — FENTANYL CITRATE (PF) 250 MCG/5ML IJ SOLN
INTRAMUSCULAR | Status: AC
  Filled 2020-01-26: qty 5

## 2020-01-26 MED ORDER — DEXAMETHASONE SODIUM PHOSPHATE 10 MG/ML IJ SOLN
INTRAMUSCULAR | Status: AC
  Filled 2020-01-26: qty 1

## 2020-01-26 MED ORDER — SUGAMMADEX SODIUM 200 MG/2ML IV SOLN
INTRAVENOUS | Status: DC | PRN
Start: 2020-01-26 — End: 2020-01-26
  Administered 2020-01-26: 300 mg via INTRAVENOUS

## 2020-01-26 MED ORDER — POVIDONE-IODINE 10 % EX SWAB: 2.0000 "application " | Freq: Once | CUTANEOUS | Status: DC

## 2020-01-26 MED ORDER — ONDANSETRON HCL 4 MG/2ML IJ SOLN
INTRAMUSCULAR | Status: AC
  Filled 2020-01-26: qty 2

## 2020-01-26 MED ORDER — TRAMADOL HCL 50 MG PO TABS: 50.0000 mg | ORAL_TABLET | Freq: Three times a day (TID) | ORAL | 0 refills | Status: DC | PRN

## 2020-01-26 MED ORDER — CEFAZOLIN SODIUM-DEXTROSE 2-4 GM/100ML-% IV SOLN
2.0000 g | INTRAVENOUS | Status: AC
  Administered 2020-01-26: 2 g via INTRAVENOUS
  Filled 2020-01-26: qty 100

## 2020-01-26 MED ORDER — PHENYLEPHRINE 40 MCG/ML (10ML) SYRINGE FOR IV PUSH (FOR BLOOD PRESSURE SUPPORT)
PREFILLED_SYRINGE | INTRAVENOUS | Status: AC
  Filled 2020-01-26: qty 10

## 2020-01-26 MED ORDER — ROCURONIUM BROMIDE 100 MG/10ML IV SOLN
INTRAVENOUS | Status: DC | PRN
  Administered 2020-01-26: 30 mg via INTRAVENOUS
  Administered 2020-01-26: 100 mg via INTRAVENOUS

## 2020-01-26 MED ORDER — ALBUMIN HUMAN 5 % IV SOLN: INTRAVENOUS | Status: DC | PRN

## 2020-01-26 MED ORDER — METHOCARBAMOL 500 MG PO TABS
ORAL_TABLET | ORAL | Status: AC
  Filled 2020-01-26: qty 2

## 2020-01-26 MED ORDER — ONDANSETRON HCL 4 MG/2ML IJ SOLN
INTRAMUSCULAR | Status: DC | PRN
  Administered 2020-01-26: 4 mg via INTRAVENOUS

## 2020-01-26 SURGICAL SUPPLY — 88 items
BANDAGE ESMARK 6X9 LF (GAUZE/BANDAGES/DRESSINGS) ×1 IMPLANT
BIT DRILL 2.5X2.75 QC CALB (BIT) ×3 IMPLANT
BIT DRILL 3.5X5.5 QC CALB (BIT) ×3 IMPLANT
BIT DRILL CALIBRATED 2.7 (BIT) ×2 IMPLANT
BIT DRILL CALIBRATED 2.7MM (BIT) ×1
BNDG ELASTIC 2X5.8 VLCR STR LF (GAUZE/BANDAGES/DRESSINGS) ×3 IMPLANT
BNDG ELASTIC 3X5.8 VLCR STR LF (GAUZE/BANDAGES/DRESSINGS) ×3 IMPLANT
BNDG ELASTIC 4X5.8 VLCR STR LF (GAUZE/BANDAGES/DRESSINGS) IMPLANT
BNDG ELASTIC 6X5.8 VLCR STR LF (GAUZE/BANDAGES/DRESSINGS) IMPLANT
BNDG ESMARK 6X9 LF (GAUZE/BANDAGES/DRESSINGS) ×3
BNDG GAUZE ELAST 4 BULKY (GAUZE/BANDAGES/DRESSINGS) ×3 IMPLANT
BONE CANC CHIPS 20CC PCAN1/4 (Bone Implant) ×3 IMPLANT
BRUSH SCRUB EZ PLAIN DRY (MISCELLANEOUS) ×6 IMPLANT
CHIPS CANC BONE 20CC PCAN1/4 (Bone Implant) ×1 IMPLANT
COVER MAYO STAND STRL (DRAPES) ×3 IMPLANT
COVER SURGICAL LIGHT HANDLE (MISCELLANEOUS) ×3 IMPLANT
COVER WAND RF STERILE (DRAPES) ×3 IMPLANT
CUFF TOURN SGL QUICK 34 (TOURNIQUET CUFF) ×2
CUFF TRNQT CYL 34X4.125X (TOURNIQUET CUFF) ×1 IMPLANT
DRAPE C-ARM 42X72 X-RAY (DRAPES) ×3 IMPLANT
DRAPE C-ARMOR (DRAPES) ×3 IMPLANT
DRAPE HALF SHEET 40X57 (DRAPES) ×3 IMPLANT
DRAPE U-SHAPE 47X51 STRL (DRAPES) ×3 IMPLANT
DRESSING PEEL AND PLC PRVNA 13 (GAUZE/BANDAGES/DRESSINGS) ×1 IMPLANT
DRSG EMULSION OIL 3X3 NADH (GAUZE/BANDAGES/DRESSINGS) IMPLANT
DRSG MEPILEX BORDER 4X4 (GAUZE/BANDAGES/DRESSINGS) ×3 IMPLANT
DRSG PEEL AND PLACE PREVENA 13 (GAUZE/BANDAGES/DRESSINGS) ×3
ELECT REM PT RETURN 9FT ADLT (ELECTROSURGICAL) ×3
ELECTRODE REM PT RTRN 9FT ADLT (ELECTROSURGICAL) ×1 IMPLANT
GAUZE SPONGE 4X4 12PLY STRL (GAUZE/BANDAGES/DRESSINGS) ×6 IMPLANT
GLOVE BIO SURGEON STRL SZ7.5 (GLOVE) ×3 IMPLANT
GLOVE BIO SURGEON STRL SZ8 (GLOVE) ×3 IMPLANT
GLOVE BIOGEL PI IND STRL 7.5 (GLOVE) ×1 IMPLANT
GLOVE BIOGEL PI IND STRL 8 (GLOVE) ×1 IMPLANT
GLOVE BIOGEL PI INDICATOR 7.5 (GLOVE) ×2
GLOVE BIOGEL PI INDICATOR 8 (GLOVE) ×2
GOWN STRL REUS W/ TWL LRG LVL3 (GOWN DISPOSABLE) ×2 IMPLANT
GOWN STRL REUS W/ TWL XL LVL3 (GOWN DISPOSABLE) ×1 IMPLANT
GOWN STRL REUS W/TWL LRG LVL3 (GOWN DISPOSABLE) ×4
GOWN STRL REUS W/TWL XL LVL3 (GOWN DISPOSABLE) ×2
K-WIRE ACE 1.6X6 (WIRE) ×12
KIT BASIN OR (CUSTOM PROCEDURE TRAY) ×3 IMPLANT
KIT DRSG PREVENA PLUS 7DAY 125 (MISCELLANEOUS) ×3 IMPLANT
KIT TURNOVER KIT B (KITS) ×3 IMPLANT
KWIRE ACE 1.6X6 (WIRE) ×4 IMPLANT
MANIFOLD NEPTUNE II (INSTRUMENTS) ×3 IMPLANT
NEEDLE HYPO 21X1.5 SAFETY (NEEDLE) IMPLANT
NS IRRIG 1000ML POUR BTL (IV SOLUTION) ×3 IMPLANT
PACK GENERAL/GYN (CUSTOM PROCEDURE TRAY) ×3 IMPLANT
PACK ORTHO EXTREMITY (CUSTOM PROCEDURE TRAY) ×3 IMPLANT
PAD ARMBOARD 7.5X6 YLW CONV (MISCELLANEOUS) ×6 IMPLANT
PAD CAST 3X4 CTTN HI CHSV (CAST SUPPLIES) ×1 IMPLANT
PAD CAST 4YDX4 CTTN HI CHSV (CAST SUPPLIES) IMPLANT
PADDING CAST COTTON 3X4 STRL (CAST SUPPLIES) ×2
PADDING CAST COTTON 4X4 STRL (CAST SUPPLIES)
PADDING CAST COTTON 6X4 STRL (CAST SUPPLIES) IMPLANT
PADDING UNDERCAST 2 STRL (CAST SUPPLIES) ×2
PADDING UNDERCAST 2X4 STRL (CAST SUPPLIES) ×1 IMPLANT
PLATE 6H LT DIST ANTLAT TIB (Plate) ×3 IMPLANT
SCREW CORT 3.5X32 (Screw) ×2 IMPLANT
SCREW CORT FT 32X3.5XNONLOCK (Screw) ×1 IMPLANT
SCREW CORT T15 32X3.5XST LCK (Screw) ×1 IMPLANT
SCREW CORT T15 TPR 55X3.5XST (Screw) ×1 IMPLANT
SCREW CORTICAL 3.5MM  32MM (Screw) ×2 IMPLANT
SCREW CORTICAL 3.5MM 14MM (Screw) ×3 IMPLANT
SCREW CORTICAL 3.5MM 50MM (Screw) ×3 IMPLANT
SCREW CORTICAL 3.5X55MM (Screw) ×3 IMPLANT
SCREW LOCK 3.5X14 DIST TIB (Screw) ×3 IMPLANT
SCREW LOCK CORT STAR 3.5X38 (Screw) ×6 IMPLANT
SCREW LOCK CORT STAR 3.5X42 (Screw) ×3 IMPLANT
SCREW LOCK CORT STAR 3.5X44 (Screw) ×9 IMPLANT
SCREW LP 3.5 (Screw) ×3 IMPLANT
SCREW NL LP 36X3.5 (Screw) ×6 IMPLANT
SPONGE LAP 18X18 RF (DISPOSABLE) ×3 IMPLANT
STAPLER VISISTAT 35W (STAPLE) IMPLANT
SUCTION FRAZIER HANDLE 10FR (MISCELLANEOUS) ×2
SUCTION TUBE FRAZIER 10FR DISP (MISCELLANEOUS) ×1 IMPLANT
SUT ETHILON 2 0 PSLX (SUTURE) ×6 IMPLANT
SUT ETHILON 3 0 PS 1 (SUTURE) ×6 IMPLANT
SUT PDS AB 2-0 CT1 27 (SUTURE) IMPLANT
SUT VIC AB 2-0 CT1 27 (SUTURE) ×12
SUT VIC AB 2-0 CT1 TAPERPNT 27 (SUTURE) ×4 IMPLANT
TOWEL GREEN STERILE (TOWEL DISPOSABLE) ×6 IMPLANT
TOWEL GREEN STERILE FF (TOWEL DISPOSABLE) ×3 IMPLANT
TUBE CONNECTING 12'X1/4 (SUCTIONS) ×1
TUBE CONNECTING 12X1/4 (SUCTIONS) ×2 IMPLANT
UNDERPAD 30X36 HEAVY ABSORB (UNDERPADS AND DIAPERS) ×3 IMPLANT
WATER STERILE IRR 1000ML POUR (IV SOLUTION) ×3 IMPLANT

## 2020-01-26 NOTE — Discharge Instructions (Signed)
[facility] Discharge Instructions   General Discharge Instructions   WEIGHT BEARING STATUS: Nonweightbearing left leg   RANGE OF MOTION/ACTIVITY: ok to move toes and knee on left leg. Activity as tolerated while maintaining weightbearing restrictions   Bone health: continue to take vitamin d and c  Wound Care: no formal wound care on L ankle. You have a special vacuum dressing in place to help with swelling and drainage control.  Make sure that the unit is charged.  It will stop working on post operative day 8.  You are to follow up with [place] on 02/03/2020. The unit may cut off on 5/18. This is not a problem, simply leave dressing (purple sponge) alone.  Start daily external fixator care on 01/28/2020.  See below   Discharge Pin Site Instructions  Dress pins daily with Kerlix roll starting on POD 2. Wrap the Kerlix so that it tamps the skin down around the pin-skin interface to prevent/limit motion of the skin relative to the pin.  (Pin-skin motion is the primary cause of pain and infection related to external fixator pin sites).  Remove any crust or coagulum that may obstruct drainage with soap and water.  After POD 3, if there is no discernable drainage on the pin site dressing, the interval for change can by increased to every other day.  You may shower with the fixator, cleaning all pin sites gently with soap and water.  If you have a surgical wound this needs to be completely dry and without drainage before showering. Alternatively you can use a washcloth with soap and water and gently clean the injured extremity and external fixator, including all pinsites and surgical wounds   The extremity can be lifted by the fixator to facilitate wound care and transfers.  Notify the office/Doctor if you experience increasing drainage, redness, or pain from a pin site, or if you notice purulent (thick, snot-like) drainage.    DVT/PE prophylaxis: continue taking daily lovenox  injections until you run out   Diet: as you were eating previously.  Can use over the counter stool softeners and bowel preparations, such as Miralax, to help with bowel movements.  Narcotics can be constipating.  Be sure to drink plenty of fluids  PAIN MEDICATION USE AND EXPECTATIONS  You have likely been given narcotic medications to help control your pain.  After a traumatic event that results in an fracture (broken bone) with or without surgery, it is ok to use narcotic pain medications to help control one's pain.  We understand that everyone responds to pain differently and each individual patient will be evaluated on a regular basis for the continued need for narcotic medications. Ideally, narcotic medication use should last no more than 6-8 weeks (coinciding with fracture healing).   As a patient it is your responsibility as well to monitor narcotic medication use and report the amount and frequency you use these medications when you come to your office visit.   We would also advise that if you are using narcotic medications, you should take a dose prior to therapy to maximize you participation.  IF YOU ARE ON NARCOTIC MEDICATIONS IT IS NOT PERMISSIBLE TO OPERATE A MOTOR VEHICLE (MOTORCYCLE/CAR/TRUCK/MOPED) OR HEAVY MACHINERY DO NOT MIX NARCOTICS WITH OTHER CNS (CENTRAL NERVOUS SYSTEM) DEPRESSANTS SUCH AS ALCOHOL   STOP SMOKING OR USING NICOTINE PRODUCTS!!!!  As discussed nicotine severely impairs your body's ability to heal surgical and traumatic wounds but also impairs bone healing.  Wounds and bone heal  by forming microscopic blood vessels (angiogenesis) and nicotine is a vasoconstrictor (essentially, shrinks blood vessels).  Therefore, if vasoconstriction occurs to these microscopic blood vessels they essentially disappear and are unable to deliver necessary nutrients to the healing tissue.  This is one modifiable factor that you can do to dramatically increase your chances of healing your  injury.    (This means no smoking, no nicotine gum, patches, etc)  DO NOT USE NONSTEROIDAL ANTI-INFLAMMATORY DRUGS (NSAID'S)  Using products such as Advil (ibuprofen), Aleve (naproxen), Motrin (ibuprofen) for additional pain control during fracture healing can delay and/or prevent the healing response.  If you would like to take over the counter (OTC) medication, Tylenol (acetaminophen) is ok.  However, some narcotic medications that are given for pain control contain acetaminophen as well. Therefore, you should not exceed more than 4000 mg of tylenol in a day if you do not have liver disease.  Also note that there are may OTC medicines, such as cold medicines and allergy medicines that my contain tylenol as well.  If you have any questions about medications and/or interactions please ask your doctor/PA or your pharmacist.      ICE AND ELEVATE INJURED/OPERATIVE EXTREMITY  Using ice and elevating the injured extremity above your heart can help with swelling and pain control.  Icing in a pulsatile fashion, such as 20 minutes on and 20 minutes off, can be followed.    Do not place ice directly on skin. Make sure there is a barrier between to skin and the ice pack.    Using frozen items such as frozen peas works well as the conform nicely to the are that needs to be iced.  USE AN ACE WRAP OR TED HOSE FOR SWELLING CONTROL  In addition to icing and elevation, Ace wraps or TED hose are used to help limit and resolve swelling.  It is recommended to use Ace wraps or TED hose until you are informed to stop.    When using Ace Wraps start the wrapping distally (farthest away from the body) and wrap proximally (closer to the body)   Example: If you had surgery on your leg or thing and you do not have a splint on, start the ace wrap at the toes and work your way up to the thigh        If you had surgery on your upper extremity and do not have a splint on, start the ace wrap at your fingers and work your way up to  the upper arm  IF YOU ARE IN A SPLINT OR CAST DO NOT REMOVE IT FOR ANY REASON   If your splint gets wet for any reason please contact the office immediately. You may shower in your splint or cast as long as you keep it dry.  This can be done by wrapping in a cast cover or garbage back (or similar)  Do Not stick any thing down your splint or cast such as pencils, money, or hangers to try and scratch yourself with.  If you feel itchy take benadryl as prescribed on the bottle for itching  IF YOU ARE IN A CAM BOOT (BLACK BOOT)  You may remove boot periodically. Perform daily dressing changes as noted below.  Wash the liner of the boot regularly and wear a sock when wearing the boot. It is recommended that you sleep in the boot until told otherwise    Call office for the following:  Temperature greater than 101F  Persistent nausea and  vomiting  Severe uncontrolled pain  Redness, tenderness, or signs of infection (pain, swelling, redness, odor or green/yellow discharge around the site)  Difficulty breathing, headache or visual disturbances  Hives  Persistent dizziness or light-headedness  Extreme fatigue  Any other questions or concerns you may have after discharge  In an emergency, call 911 or go to an Emergency Department at a nearby hospital    CALL THE OFFICE WITH ANY QUESTIONS OR CONCERNS: [phone]   VISIT OUR WEBSITE FOR ADDITIONAL INFORMATION: [email or web address]

## 2020-01-26 NOTE — Anesthesia Postprocedure Evaluation (Signed)
Anesthesia Post Note  Patient: Robert Hogan  Procedure(s) Performed: OPEN REDUCTION INTERNAL FIXATION (ORIF) PILON FRACTURE (Left Ankle)     Patient location during evaluation: PACU Anesthesia Type: General Level of consciousness: sedated Pain management: pain level controlled Vital Signs Assessment: post-procedure vital signs reviewed and stable Respiratory status: spontaneous breathing and respiratory function stable Cardiovascular status: stable Postop Assessment: no apparent nausea or vomiting Anesthetic complications: no    Last Vitals:  Vitals:   01/26/20 1605 01/26/20 1620  BP: 123/77 118/84  Pulse: 96 96  Resp: 14 12  Temp:  36.7 C  SpO2: 99% 100%                  Jillana Selph DANIEL

## 2020-01-26 NOTE — Anesthesia Procedure Notes (Signed)
Procedure Name: Intubation Date/Time: 01/26/2020 11:58 AM Performed by: Tillman Abide, CRNA Pre-anesthesia Checklist: Patient identified, Emergency Drugs available, Suction available and Patient being monitored Patient Re-evaluated:Patient Re-evaluated prior to induction Oxygen Delivery Method: Circle System Utilized Preoxygenation: Pre-oxygenation with 100% oxygen Induction Type: IV induction Ventilation: Mask ventilation without difficulty Laryngoscope Size: Miller and 3 Grade View: Grade I Tube type: Oral Tube size: 7.5 mm Number of attempts: 1 Airway Equipment and Method: Stylet Placement Confirmation: ETT inserted through vocal cords under direct vision,  positive ETCO2 and breath sounds checked- equal and bilateral Secured at: 23 cm Tube secured with: Tape Dental Injury: Teeth and Oropharynx as per pre-operative assessment

## 2020-01-26 NOTE — Anesthesia Preprocedure Evaluation (Addendum)
Anesthesia Evaluation  Patient identified by MRN, date of birth, ID band Patient awake    Reviewed: Allergy & Precautions, NPO status , Patient's Chart, lab work & pertinent test results  History of Anesthesia Complications Negative for: history of anesthetic complications  Airway Mallampati: II  TM Distance: >3 FB Neck ROM: Full    Dental no notable dental hx. (+) Dental Advisory Given   Pulmonary former smoker,    Pulmonary exam normal        Cardiovascular Normal cardiovascular exam   EKG - SR, LAFB, prolonged QTc    Neuro/Psych negative neurological ROS  negative psych ROS   GI/Hepatic negative GI ROS, Neg liver ROS,   Endo/Other  negative endocrine ROS  Renal/GU negative Renal ROS     Musculoskeletal   Abdominal   Peds  Hematology negative hematology ROS (+)   Anesthesia Other Findings   Reproductive/Obstetrics                            Anesthesia Physical  Anesthesia Plan  ASA: I  Anesthesia Plan: General   Post-op Pain Management:  Regional for Post-op pain   Induction: Intravenous  PONV Risk Score and Plan: 3 and Treatment may vary due to age or medical condition, Ondansetron, Midazolam and Dexamethasone  Airway Management Planned: Oral ETT  Additional Equipment: None  Intra-op Plan:   Post-operative Plan: Extubation in OR  Informed Consent: I have reviewed the patients History and Physical, chart, labs and discussed the procedure including the risks, benefits and alternatives for the proposed anesthesia with the patient or authorized representative who has indicated his/her understanding and acceptance.     Dental advisory given  Plan Discussed with: CRNA and Anesthesiologist  Anesthesia Plan Comments:        Anesthesia Quick Evaluation

## 2020-01-26 NOTE — Transfer of Care (Signed)
Immediate Anesthesia Transfer of Care Note  Patient: Robert Hogan  Procedure(s) Performed: OPEN REDUCTION INTERNAL FIXATION (ORIF) PILON FRACTURE (Left Ankle)  Patient Location: PACU  Anesthesia Type:GA combined with regional for post-op pain  Level of Consciousness: drowsy  Airway & Oxygen Therapy: Patient Spontanous Breathing and Patient connected to nasal cannula oxygen  Post-op Assessment: Report given to RN, Post -op Vital signs reviewed and stable and Patient moving all extremities  Post vital signs: Reviewed and stable  Last Vitals:  Vitals Value Taken Time  BP 115/72 01/26/20 1518  Temp    Pulse 96 01/26/20 1521  Resp 15 01/26/20 1521  SpO2 100 % 01/26/20 1521  Vitals shown include unvalidated device data.  Last Pain:  Vitals:   01/26/20 0821  TempSrc:   PainSc: 6       Patients Stated Pain Goal: 3 (01/26/20 2080)  Complications: No apparent anesthesia complications

## 2020-01-26 NOTE — H&P (Signed)
H&P Interval  No significant changes in history or examination since discharge.  I discussed with the patient the risks and benefits of surgery for his left ankle pilon fracture, including the possibility of infection, nerve injury, vessel injury, wound breakdown, arthritis, symptomatic hardware, DVT/ PE, loss of motion, malunion, nonunion, and need for further surgery among others.  We also specifically discussed the probable need to maintain his external fixator.  He acknowledged these risks and wished to proceed.  [name], MD [facility], [facility] [phone]

## 2020-01-26 NOTE — Brief Op Note (Signed)
01/26/2020  3:52 PM  PATIENT:  Robert Hogan  49 y.o. male  PRE-OPERATIVE DIAGNOSIS:  LEFT PILON FRACTURE  POST-OPERATIVE DIAGNOSIS:  left pilon fracture  PROCEDURE:  Procedure(s): OPEN REDUCTION INTERNAL FIXATION (ORIF) PILON FRACTURE (Left), TIBIA ONLY  SURGEON:  Surgeon(s) and Role:    [name], MD - Primary  PHYSICIAN ASSISTANT: [name], PA-C  ANESTHESIA:   regional and general  EBL:  700 mL   BLOOD ADMINISTERED:none  DRAINS: none   LOCAL MEDICATIONS USED:  NONE  SPECIMEN:  No Specimen  DISPOSITION OF SPECIMEN:  N/A  COUNTS:  YES  TOURNIQUET:  * No tourniquets in log *  DICTATION: Epic written  PLAN OF CARE: Discharge to home after PACU  PATIENT DISPOSITION:  PACU - hemodynamically stable.   Delay start of Pharmacological VTE agent (>24hrs) due to surgical blood loss or risk of bleeding: no

## 2020-01-26 NOTE — Anesthesia Procedure Notes (Signed)
Anesthesia Regional Block: Popliteal block   Pre-Anesthetic Checklist: ,, timeout performed, Correct Patient, Correct Site, Correct Laterality, Correct Procedure, Correct Position, site marked, Risks and benefits discussed,  Surgical consent,  Pre-op evaluation,  At surgeon's request and post-op pain management  Laterality: Left  Prep: chloraprep       Needles:  Injection technique: Single-shot  Needle Type: Echogenic Stimulator Needle          Additional Needles:   Narrative:  Start time: 01/26/2020 9:46 AM End time: 01/26/2020 9:56 AM Injection made incrementally with aspirations every 5 mL.  Performed by: Personally  Anesthesiologist: Heather Roberts, MD  Additional Notes: A functioning IV was confirmed and monitors were applied.  Sterile prep and drape, hand hygiene and sterile gloves were used.  Negative aspiration and test dose prior to incremental administration of local anesthetic. The patient tolerated the procedure well.Ultrasound  guidance: relevant anatomy identified, needle position confirmed, local anesthetic spread visualized around nerve(s), vascular puncture avoided.  Image printed for medical record.

## 2020-01-29 ENCOUNTER — Encounter: Payer: Self-pay | Admitting: *Deleted
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
## 2020-02-01 ENCOUNTER — Encounter: Payer: Self-pay | Admitting: *Deleted

## 2020-02-26 ENCOUNTER — Other Ambulatory Visit: Payer: Self-pay

## 2020-02-26 ENCOUNTER — Encounter (HOSPITAL_COMMUNITY): Payer: Self-pay | Admitting: Orthopedic Surgery
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
## 2020-02-26 NOTE — Anesthesia Preprocedure Evaluation (Addendum)
Anesthesia Evaluation  Patient identified by MRN, date of birth, ID band Patient awake    Reviewed: Allergy & Precautions, NPO status , Patient's Chart, lab work & pertinent test results  Airway Mallampati: II  TM Distance: >3 FB Neck ROM: Full    Dental no notable dental hx.    Pulmonary neg pulmonary ROS, former smoker,    Pulmonary exam normal breath sounds clear to auscultation       Cardiovascular negative cardio ROS Normal cardiovascular exam Rhythm:Regular Rate:Normal     Neuro/Psych negative neurological ROS  negative psych ROS   GI/Hepatic negative GI ROS, Neg liver ROS,   Endo/Other  negative endocrine ROS  Renal/GU negative Renal ROS  negative genitourinary   Musculoskeletal negative musculoskeletal ROS (+)   Abdominal   Peds negative pediatric ROS (+)  Hematology negative hematology ROS (+)   Anesthesia Other Findings   Reproductive/Obstetrics negative OB ROS                             Anesthesia Physical Anesthesia Plan  ASA: II  Anesthesia Plan: General   Post-op Pain Management:    Induction: Intravenous  PONV Risk Score and Plan:   Airway Management Planned: LMA  Additional Equipment:   Intra-op Plan:   Post-operative Plan:   Informed Consent:   Plan Discussed with: CRNA and Surgeon  Anesthesia Plan Comments: (See PAT note written by Shonna Chock, PA-C. )       Anesthesia Quick Evaluation                                  Anesthesia Evaluation  Patient identified by MRN, date of birth, ID band Patient awake    Reviewed: Allergy & Precautions, NPO status , Patient's Chart, lab work & pertinent test results  History of Anesthesia Complications Negative for: history of anesthetic complications  Airway Mallampati: II  TM Distance: >3 FB Neck ROM: Full    Dental no notable dental hx. (+) Dental Advisory Given   Pulmonary former  smoker,    Pulmonary exam normal        Cardiovascular Normal cardiovascular exam   EKG - SR, LAFB, prolonged QTc    Neuro/Psych negative neurological ROS  negative psych ROS   GI/Hepatic negative GI ROS, Neg liver ROS,   Endo/Other  negative endocrine ROS  Renal/GU negative Renal ROS     Musculoskeletal   Abdominal   Peds  Hematology negative hematology ROS (+)   Anesthesia Other Findings   Reproductive/Obstetrics                            Anesthesia Physical  Anesthesia Plan  ASA: I  Anesthesia Plan: General   Post-op Pain Management:  Regional for Post-op pain   Induction: Intravenous  PONV Risk Score and Plan: 3 and Treatment may vary due to age or medical condition, Ondansetron, Midazolam and Dexamethasone  Airway Management Planned: Oral ETT  Additional Equipment: None  Intra-op Plan:   Post-operative Plan: Extubation in OR  Informed Consent: I have reviewed the patients History and Physical, chart, labs and discussed the procedure including the risks, benefits and alternatives for the proposed anesthesia with the patient or authorized representative who has indicated his/her understanding and acceptance.     Dental advisory given  Plan Discussed with: CRNA and Anesthesiologist  Anesthesia Plan Comments:        Anesthesia Quick Evaluation

## 2020-02-26 NOTE — Progress Notes (Signed)
Pt denies SOB, chest pain, and being under the care of a cardiologist. Pt denies having a PCP. Pt denies having a stress test, echo and cardiac cath. Pt denies recent labs. Pt made aware to stop taking vitamins, fish oil, Melatonin and herbal medications. Do not take any NSAIDs ie: Ibuprofen, Advil, Naproxen (Aleve), Motrin, BC and Goody Powder. Pt stated that he does not take Aspirin. Pt reminded to quarantine. Pt verbalized understanding of all pre-op instructions. PA, Anesthesiology, asked to review pt history, see note.
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
## 2020-02-29 ENCOUNTER — Encounter (HOSPITAL_COMMUNITY): Payer: Self-pay | Admitting: Orthopedic Surgery

## 2020-02-29 ENCOUNTER — Encounter (HOSPITAL_COMMUNITY)
Admission: RE | Disposition: A | Discharge status: Home / Self Care | Payer: Self-pay | Source: Home / Self Care | Attending: Orthopedic Surgery

## 2020-02-29 ENCOUNTER — Other Ambulatory Visit: Payer: Self-pay

## 2020-02-29 ENCOUNTER — Ambulatory Visit (HOSPITAL_COMMUNITY): Payer: Self-pay

## 2020-02-29 ENCOUNTER — Ambulatory Visit (HOSPITAL_COMMUNITY): Payer: Self-pay | Admitting: Vascular Surgery

## 2020-02-29 ENCOUNTER — Ambulatory Visit (HOSPITAL_COMMUNITY)
Admission: RE | Admit: 2020-02-29 | Discharge: 2020-02-29 | Disposition: A | Discharge status: Home / Self Care | Payer: Self-pay | Attending: Orthopedic Surgery | Admitting: Orthopedic Surgery

## 2020-02-29 DIAGNOSIS — Z20822 Contact with and (suspected) exposure to covid-19: Secondary | ICD-10-CM | POA: Insufficient documentation

## 2020-02-29 DIAGNOSIS — L97819 Non-pressure chronic ulcer of other part of right lower leg with unspecified severity: Secondary | ICD-10-CM | POA: Insufficient documentation

## 2020-02-29 DIAGNOSIS — Z87891 Personal history of nicotine dependence: Secondary | ICD-10-CM | POA: Insufficient documentation

## 2020-02-29 DIAGNOSIS — T847XXA Infection and inflammatory reaction due to other internal orthopedic prosthetic devices, implants and grafts, initial encounter: Secondary | ICD-10-CM | POA: Insufficient documentation

## 2020-02-29 DIAGNOSIS — Y838 Other surgical procedures as the cause of abnormal reaction of the patient, or of later complication, without mention of misadventure at the time of the procedure: Secondary | ICD-10-CM | POA: Insufficient documentation

## 2020-02-29 DIAGNOSIS — Z419 Encounter for procedure for purposes other than remedying health state, unspecified: Secondary | ICD-10-CM

## 2020-02-29 DIAGNOSIS — L97419 Non-pressure chronic ulcer of right heel and midfoot with unspecified severity: Secondary | ICD-10-CM | POA: Insufficient documentation

## 2020-02-29 HISTORY — PX: EXTERNAL FIXATION REMOVAL: SHX5040

## 2020-02-29 LAB — SARS CORONAVIRUS 2 BY RT PCR (HOSPITAL ORDER, PERFORMED IN ~~LOC~~ HOSPITAL LAB): SARS Coronavirus 2: NEGATIVE

## 2020-02-29 SURGERY — REMOVAL, EXTERNAL FIXATION DEVICE, LOWER EXTREMITY
Anesthesia: General | Laterality: Left

## 2020-02-29 MED ORDER — CHLORHEXIDINE GLUCONATE 0.12 % MT SOLN
15.0000 mL | Freq: Once | OROMUCOSAL | Status: AC
  Administered 2020-02-29: 15 mL via OROMUCOSAL
  Filled 2020-02-29: qty 15

## 2020-02-29 MED ORDER — PROMETHAZINE HCL 25 MG/ML IJ SOLN: 6.2500 mg | INTRAMUSCULAR | Status: DC | PRN

## 2020-02-29 MED ORDER — MIDAZOLAM HCL 2 MG/2ML IJ SOLN
INTRAMUSCULAR | Status: AC
  Filled 2020-02-29: qty 2

## 2020-02-29 MED ORDER — MEPERIDINE HCL 25 MG/ML IJ SOLN: 6.2500 mg | INTRAMUSCULAR | Status: DC | PRN

## 2020-02-29 MED ORDER — FENTANYL CITRATE (PF) 100 MCG/2ML IJ SOLN: 25.0000 ug | INTRAMUSCULAR | Status: DC | PRN

## 2020-02-29 MED ORDER — FENTANYL CITRATE (PF) 250 MCG/5ML IJ SOLN
INTRAMUSCULAR | Status: AC
  Filled 2020-02-29: qty 5

## 2020-02-29 MED ORDER — PHENYLEPHRINE 40 MCG/ML (10ML) SYRINGE FOR IV PUSH (FOR BLOOD PRESSURE SUPPORT)
PREFILLED_SYRINGE | INTRAVENOUS | Status: AC
  Filled 2020-02-29: qty 10

## 2020-02-29 MED ORDER — FENTANYL CITRATE (PF) 100 MCG/2ML IJ SOLN
25.0000 ug | INTRAMUSCULAR | Status: DC | PRN
Start: 2020-02-29 — End: 2020-02-29
  Administered 2020-02-29: 25 ug via INTRAVENOUS

## 2020-02-29 MED ORDER — KETOROLAC TROMETHAMINE 10 MG PO TABS: 10.0000 mg | ORAL_TABLET | Freq: Four times a day (QID) | ORAL | Status: DC | PRN

## 2020-02-29 MED ORDER — MIDAZOLAM HCL 5 MG/5ML IJ SOLN
INTRAMUSCULAR | Status: DC | PRN
  Administered 2020-02-29: 2 mg via INTRAVENOUS

## 2020-02-29 MED ORDER — OXYCODONE HCL 5 MG PO TABS
ORAL_TABLET | ORAL | Status: AC
  Filled 2020-02-29: qty 1

## 2020-02-29 MED ORDER — CEFAZOLIN SODIUM-DEXTROSE 2-4 GM/100ML-% IV SOLN
2.0000 g | INTRAVENOUS | Status: AC
  Administered 2020-02-29: 2 g via INTRAVENOUS
  Filled 2020-02-29: qty 100

## 2020-02-29 MED ORDER — PROPOFOL 10 MG/ML IV BOLUS
INTRAVENOUS | Status: AC
  Filled 2020-02-29: qty 20

## 2020-02-29 MED ORDER — OXYCODONE HCL 5 MG/5ML PO SOLN: 5.0000 mg | Freq: Once | ORAL | Status: DC | PRN

## 2020-02-29 MED ORDER — ACETAMINOPHEN 160 MG/5ML PO SOLN: 325.0000 mg | ORAL | Status: DC | PRN

## 2020-02-29 MED ORDER — ORAL CARE MOUTH RINSE: 15.0000 mL | Freq: Once | OROMUCOSAL | Status: AC

## 2020-02-29 MED ORDER — FENTANYL CITRATE (PF) 100 MCG/2ML IJ SOLN
INTRAMUSCULAR | Status: AC
  Filled 2020-02-29: qty 2

## 2020-02-29 MED ORDER — ONDANSETRON 4 MG PO TBDP
4.0000 mg | ORAL_TABLET | Freq: Three times a day (TID) | ORAL | 0 refills | Status: DC | PRN
Start: 2020-02-29 — End: 2020-04-12

## 2020-02-29 MED ORDER — LIDOCAINE HCL (CARDIAC) PF 100 MG/5ML IV SOSY
PREFILLED_SYRINGE | INTRAVENOUS | Status: DC | PRN
  Administered 2020-02-29: 80 mg via INTRAVENOUS

## 2020-02-29 MED ORDER — 0.9 % SODIUM CHLORIDE (POUR BTL) OPTIME
TOPICAL | Status: DC | PRN
  Administered 2020-02-29: 1000 mL

## 2020-02-29 MED ORDER — ACETAMINOPHEN 325 MG PO TABS: 325.0000 mg | ORAL_TABLET | ORAL | Status: DC | PRN

## 2020-02-29 MED ORDER — LACTATED RINGERS IV SOLN: INTRAVENOUS | Status: DC

## 2020-02-29 MED ORDER — ROCURONIUM BROMIDE 10 MG/ML (PF) SYRINGE
PREFILLED_SYRINGE | INTRAVENOUS | Status: AC
  Filled 2020-02-29: qty 10

## 2020-02-29 MED ORDER — ONDANSETRON HCL 4 MG/2ML IJ SOLN: 4.0000 mg | Freq: Once | INTRAMUSCULAR | Status: DC | PRN

## 2020-02-29 MED ORDER — NALOXONE HCL 0.4 MG/ML IJ SOLN
INTRAMUSCULAR | Status: DC | PRN
  Administered 2020-02-29 (×2): 2 ug via INTRAVENOUS

## 2020-02-29 MED ORDER — LIDOCAINE 2% (20 MG/ML) 5 ML SYRINGE
INTRAMUSCULAR | Status: DC | PRN
  Administered 2020-02-29: 80 mg via INTRAVENOUS

## 2020-02-29 MED ORDER — ONDANSETRON HCL 4 MG/2ML IJ SOLN
INTRAMUSCULAR | Status: DC | PRN
  Administered 2020-02-29: 4 mg via INTRAVENOUS

## 2020-02-29 MED ORDER — DEXAMETHASONE SODIUM PHOSPHATE 10 MG/ML IJ SOLN
INTRAMUSCULAR | Status: DC | PRN
  Administered 2020-02-29: 10 mg via INTRAVENOUS

## 2020-02-29 MED ORDER — OXYCODONE HCL 5 MG/5ML PO SOLN: 5.0000 mg | Freq: Once | ORAL | Status: AC | PRN

## 2020-02-29 MED ORDER — OXYCODONE HCL 5 MG PO TABS
5.0000 mg | ORAL_TABLET | Freq: Once | ORAL | Status: AC | PRN
  Administered 2020-02-29: 5 mg via ORAL

## 2020-02-29 MED ORDER — OXYCODONE HCL 5 MG PO TABS
5.0000 mg | ORAL_TABLET | Freq: Once | ORAL | Status: DC | PRN
Start: 2020-02-29 — End: 2020-02-29

## 2020-02-29 MED ORDER — PROPOFOL 10 MG/ML IV BOLUS
INTRAVENOUS | Status: DC | PRN
  Administered 2020-02-29: 200 mg via INTRAVENOUS

## 2020-02-29 MED ORDER — FENTANYL CITRATE (PF) 100 MCG/2ML IJ SOLN
INTRAMUSCULAR | Status: DC | PRN
  Administered 2020-02-29: 100 ug via INTRAVENOUS

## 2020-02-29 MED ORDER — KETOROLAC TROMETHAMINE 10 MG PO TABS
10.0000 mg | ORAL_TABLET | Freq: Four times a day (QID) | ORAL | 0 refills | Status: DC | PRN
Start: 2020-02-29 — End: 2020-04-12

## 2020-02-29 SURGICAL SUPPLY — 40 items
BNDG ELASTIC 4X5.8 VLCR STR LF (GAUZE/BANDAGES/DRESSINGS) ×3 IMPLANT
BNDG ELASTIC 6X5.8 VLCR STR LF (GAUZE/BANDAGES/DRESSINGS) ×3 IMPLANT
BNDG GAUZE ELAST 4 BULKY (GAUZE/BANDAGES/DRESSINGS) ×3 IMPLANT
BRUSH SCRUB EZ PLAIN DRY (MISCELLANEOUS) ×6 IMPLANT
COVER SURGICAL LIGHT HANDLE (MISCELLANEOUS) ×6 IMPLANT
COVER WAND RF STERILE (DRAPES) ×3 IMPLANT
DRAPE C-ARM 42X72 X-RAY (DRAPES) IMPLANT
DRAPE C-ARMOR (DRAPES) ×3 IMPLANT
DRAPE U-SHAPE 47X51 STRL (DRAPES) ×3 IMPLANT
DRSG ADAPTIC 3X8 NADH LF (GAUZE/BANDAGES/DRESSINGS) ×3 IMPLANT
ELECT REM PT RETURN 9FT ADLT (ELECTROSURGICAL) ×3
ELECTRODE REM PT RTRN 9FT ADLT (ELECTROSURGICAL) ×1 IMPLANT
GAUZE SPONGE 4X4 12PLY STRL (GAUZE/BANDAGES/DRESSINGS) ×3 IMPLANT
GLOVE BIO SURGEON STRL SZ7.5 (GLOVE) ×3 IMPLANT
GLOVE BIO SURGEON STRL SZ8 (GLOVE) ×3 IMPLANT
GLOVE BIOGEL PI IND STRL 7.5 (GLOVE) ×1 IMPLANT
GLOVE BIOGEL PI IND STRL 8 (GLOVE) ×1 IMPLANT
GLOVE BIOGEL PI INDICATOR 7.5 (GLOVE) ×2
GLOVE BIOGEL PI INDICATOR 8 (GLOVE) ×2
GOWN STRL REIN XL XLG (GOWN DISPOSABLE) ×3 IMPLANT
GOWN STRL REUS W/ TWL LRG LVL3 (GOWN DISPOSABLE) ×2 IMPLANT
GOWN STRL REUS W/ TWL XL LVL3 (GOWN DISPOSABLE) ×1 IMPLANT
GOWN STRL REUS W/TWL LRG LVL3 (GOWN DISPOSABLE) ×4
GOWN STRL REUS W/TWL XL LVL3 (GOWN DISPOSABLE) ×2
KIT BASIN OR (CUSTOM PROCEDURE TRAY) ×3 IMPLANT
KIT TURNOVER KIT B (KITS) ×3 IMPLANT
MANIFOLD NEPTUNE II (INSTRUMENTS) ×3 IMPLANT
NS IRRIG 1000ML POUR BTL (IV SOLUTION) ×3 IMPLANT
PACK ORTHO EXTREMITY (CUSTOM PROCEDURE TRAY) ×3 IMPLANT
PAD ABD 8X10 STRL (GAUZE/BANDAGES/DRESSINGS) ×3 IMPLANT
PAD ARMBOARD 7.5X6 YLW CONV (MISCELLANEOUS) ×6 IMPLANT
PAD CAST 4YDX4 CTTN HI CHSV (CAST SUPPLIES) ×1 IMPLANT
PADDING CAST COTTON 4X4 STRL (CAST SUPPLIES) ×3
PADDING CAST COTTON 6X4 STRL (CAST SUPPLIES) ×9 IMPLANT
SPONGE LAP 18X18 RF (DISPOSABLE) ×3 IMPLANT
STAPLER VISISTAT 35W (STAPLE) IMPLANT
TOWEL GREEN STERILE (TOWEL DISPOSABLE) ×6 IMPLANT
TOWEL GREEN STERILE FF (TOWEL DISPOSABLE) ×6 IMPLANT
UNDERPAD 30X36 HEAVY ABSORB (UNDERPADS AND DIAPERS) ×3 IMPLANT
WATER STERILE IRR 1000ML POUR (IV SOLUTION) ×6 IMPLANT

## 2020-02-29 NOTE — Discharge Instructions (Addendum)
Orthopaedic Trauma Service Discharge Instructions   General Discharge Instructions   WEIGHT BEARING STATUS: Nonweightbearing Left leg   RANGE OF MOTION/ACTIVITY: ok to move toes and knee. Activity as tolerated while maintaining weightbearing restrictions    Wound Care: keep dressings clean and dry.  Do not remove boot until follow up appointment   Diet: as you were eating previously.  Can use over the counter stool softeners and bowel preparations, such as Miralax, to help with bowel movements.  Narcotics can be constipating.  Be sure to drink plenty of fluids  PAIN MEDICATION USE AND EXPECTATIONS  You have likely been given narcotic medications to help control your pain.  After a traumatic event that results in an fracture (broken bone) with or without surgery, it is ok to use narcotic pain medications to help control one's pain.  We understand that everyone responds to pain differently and each individual patient will be evaluated on a regular basis for the continued need for narcotic medications. Ideally, narcotic medication use should last no more than 6-8 weeks (coinciding with fracture healing).   As a patient it is your responsibility as well to monitor narcotic medication use and report the amount and frequency you use these medications when you come to your office visit.   We would also advise that if you are using narcotic medications, you should take a dose prior to therapy to maximize you participation.  IF YOU ARE ON NARCOTIC MEDICATIONS IT IS NOT PERMISSIBLE TO OPERATE A MOTOR VEHICLE (MOTORCYCLE/CAR/TRUCK/MOPED) OR HEAVY MACHINERY DO NOT MIX NARCOTICS WITH OTHER CNS (CENTRAL NERVOUS SYSTEM) DEPRESSANTS SUCH AS ALCOHOL   STOP SMOKING OR USING NICOTINE PRODUCTS!!!!  As discussed nicotine severely impairs your body's ability to heal surgical and traumatic wounds but also impairs bone healing.  Wounds and bone heal by forming microscopic blood vessels (angiogenesis) and  nicotine is a vasoconstrictor (essentially, shrinks blood vessels).  Therefore, if vasoconstriction occurs to these microscopic blood vessels they essentially disappear and are unable to deliver necessary nutrients to the healing tissue.  This is one modifiable factor that you can do to dramatically increase your chances of healing your injury.    (This means no smoking, no nicotine gum, patches, etc)  DO NOT USE NONSTEROIDAL ANTI-INFLAMMATORY DRUGS (NSAID'S)  Using products such as Advil (ibuprofen), Aleve (naproxen), Motrin (ibuprofen) for additional pain control during fracture healing can delay and/or prevent the healing response.  If you would like to take over the counter (OTC) medication, Tylenol (acetaminophen) is ok.  However, some narcotic medications that are given for pain control contain acetaminophen as well. Therefore, you should not exceed more than 4000 mg of tylenol in a day if you do not have liver disease.  Also note that there are may OTC medicines, such as cold medicines and allergy medicines that my contain tylenol as well.  If you have any questions about medications and/or interactions please ask your doctor/PA or your pharmacist.      ICE AND ELEVATE INJURED/OPERATIVE EXTREMITY  Using ice and elevating the injured extremity above your heart can help with swelling and pain control.  Icing in a pulsatile fashion, such as 20 minutes on and 20 minutes off, can be followed.    Do not place ice directly on skin. Make sure there is a barrier between to skin and the ice pack.    Using frozen items such as frozen peas works well as the conform nicely to the are that needs to be iced.  USE AN ACE WRAP OR TED HOSE FOR SWELLING CONTROL  In addition to icing and elevation, Ace wraps or TED hose are used to help limit and resolve swelling.  It is recommended to use Ace wraps or TED hose until you are informed to stop.    When using Ace Wraps start the wrapping distally (farthest away from  the body) and wrap proximally (closer to the body)   Example: If you had surgery on your leg or thing and you do not have a splint on, start the ace wrap at the toes and work your way up to the thigh        If you had surgery on your upper extremity and do not have a splint on, start the ace wrap at your fingers and work your way up to the upper arm  IF YOU ARE IN A SPLINT OR CAST DO NOT REMOVE IT FOR ANY REASON   If your splint gets wet for any reason please contact the office immediately. You may shower in your splint or cast as long as you keep it dry.  This can be done by wrapping in a cast cover or garbage back (or similar)  Do Not stick any thing down your splint or cast such as pencils, money, or hangers to try and scratch yourself with.  If you feel itchy take benadryl as prescribed on the bottle for itching  IF YOU ARE IN A CAM BOOT (BLACK BOOT)  You may remove boot periodically. Perform daily dressing changes as noted below.  Wash the liner of the boot regularly and wear a sock when wearing the boot. It is recommended that you sleep in the boot until told otherwise    Call office for the following:  Temperature greater than 101F  Persistent nausea and vomiting  Severe uncontrolled pain  Redness, tenderness, or signs of infection (pain, swelling, redness, odor or green/yellow discharge around the site)  Difficulty breathing, headache or visual disturbances  Hives  Persistent dizziness or light-headedness  Extreme fatigue  Any other questions or concerns you may have after discharge  In an emergency, call 911 or go to an Emergency Department at a nearby hospital    CALL THE OFFICE WITH ANY QUESTIONS OR CONCERNS: [phone]   VISIT OUR WEBSITE FOR ADDITIONAL INFORMATION: [email or web address]

## 2020-02-29 NOTE — Transfer of Care (Signed)
Immediate Anesthesia Transfer of Care Note  Patient: Robert Hogan  Procedure(s) Performed: REMOVAL EXTERNAL FIXATION LEG (Left )  Patient Location: PACU  Anesthesia Type:General  Level of Consciousness: awake, alert  and oriented  Airway & Oxygen Therapy: Patient Spontanous Breathing and Patient connected to nasal cannula oxygen  Post-op Assessment: Report given to RN, Post -op Vital signs reviewed and stable and Patient moving all extremities X 4  Post vital signs: Reviewed and stable  Last Vitals:  Vitals Value Taken Time  BP 125/87 02/29/20 1303  Temp    Pulse 73 02/29/20 1305  Resp 18 02/29/20 1305  SpO2 100 % 02/29/20 1305  Vitals shown include unvalidated device data.  Last Pain:  Vitals:   02/29/20 0812  TempSrc:   PainSc: 0-No pain         Complications: No complications documented.

## 2020-02-29 NOTE — Progress Notes (Signed)
Orthopedic Tech Progress Note Patient Details:  Robert Hogan [birth year] [id number] PA PAUL text and requested a MEDIUM CAM WALKER BOOT to be dropped off at OR DESK. Ortho Devices Type of Ortho Device: CAM walker Ortho Device/Splint Interventions: Other (comment)   Post Interventions Patient Tolerated: Other (comment) Instructions Provided: Other (comment)   Donald Pore 02/29/2020, 12:47 PM

## 2020-02-29 NOTE — Anesthesia Procedure Notes (Signed)
Procedure Name: LMA Insertion Date/Time: 02/29/2020 12:13 PM Performed by: Carmela Rima, CRNA Pre-anesthesia Checklist: Timeout performed, Patient being monitored, Emergency Drugs available, Suction available and Patient identified Patient Re-evaluated:Patient Re-evaluated prior to induction Oxygen Delivery Method: Circle system utilized Preoxygenation: Pre-oxygenation with 100% oxygen Induction Type: IV induction Ventilation: Mask ventilation without difficulty LMA: LMA inserted LMA Size: 5.0 Tube type: LMA placed by Dr. Mal Amabile. Number of attempts: 1 Placement Confirmation: ETT inserted through vocal cords under direct vision,  positive ETCO2 and breath sounds checked- equal and bilateral Tube secured with: Tape Dental Injury: Teeth and Oropharynx as per pre-operative assessment

## 2020-03-01 ENCOUNTER — Encounter (HOSPITAL_COMMUNITY): Payer: Self-pay | Admitting: Orthopedic Surgery

## 2020-03-01 NOTE — Anesthesia Postprocedure Evaluation (Addendum)
Anesthesia Post Note  Patient: Robert Hogan  Procedure(s) Performed: REMOVAL EXTERNAL FIXATION LEG (Left )     Patient location during evaluation: PACU Anesthesia Type: General Level of consciousness: awake and alert Pain management: pain level controlled Vital Signs Assessment: post-procedure vital signs reviewed and stable Respiratory status: spontaneous breathing, nonlabored ventilation and respiratory function stable Cardiovascular status: blood pressure returned to baseline and stable Postop Assessment: no apparent nausea or vomiting Anesthetic complications: no   No complications documented.  Last Vitals:  Vitals:   02/29/20 1415 02/29/20 1428  BP: 140/84 136/72  Pulse: 64 69  Resp: 14 16  Temp: (!) 36.2 C   SpO2: 100% 100%    Last Pain:  Vitals:   02/29/20 1400  TempSrc:   PainSc: 4                  Beryle Lathe

## 2020-03-07 ENCOUNTER — Encounter (HOSPITAL_COMMUNITY): Payer: Self-pay | Admitting: Orthopedic Surgery
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
## 2020-04-12 ENCOUNTER — Other Ambulatory Visit: Payer: Self-pay

## 2020-04-12 ENCOUNTER — Encounter: Payer: Self-pay | Admitting: Physician Assistant

## 2020-04-12 ENCOUNTER — Ambulatory Visit: Payer: Self-pay | Admitting: Physician Assistant

## 2020-04-12 VITALS — BP 120/90 | HR 79 | Temp 98.1°F | Wt 175.8 lb

## 2020-04-12 DIAGNOSIS — Z7689 Persons encountering health services in other specified circumstances: Secondary | ICD-10-CM

## 2020-04-12 DIAGNOSIS — Z13220 Encounter for screening for lipoid disorders: Secondary | ICD-10-CM

## 2020-04-12 DIAGNOSIS — Z131 Encounter for screening for diabetes mellitus: Secondary | ICD-10-CM
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
## 2021-04-12 ENCOUNTER — Ambulatory Visit: Payer: Self-pay | Admitting: Physician Assistant

## 2021-04-20 ENCOUNTER — Encounter: Payer: Self-pay | Admitting: Physician Assistant

## 2021-07-08 IMAGING — RF DG ANKLE COMPLETE 3+V*L*
1 series · 8 of 8 positions shown · non-contrast
Comparison: None.

CLINICAL DATA: Left ankle fracture repair

FLUOROSCOPY TIME:  Not provided.
Images: 8
EXAM:
LEFT ANKLE COMPLETE - 3+ VIEW

[Series 1: run · 8 of 8 slices shown]
[im 1/8]
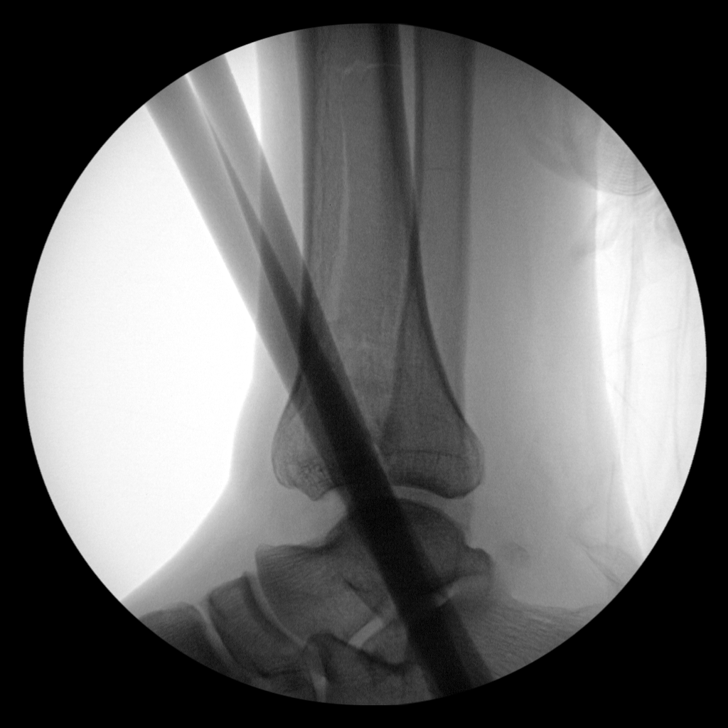
[im 2/8]
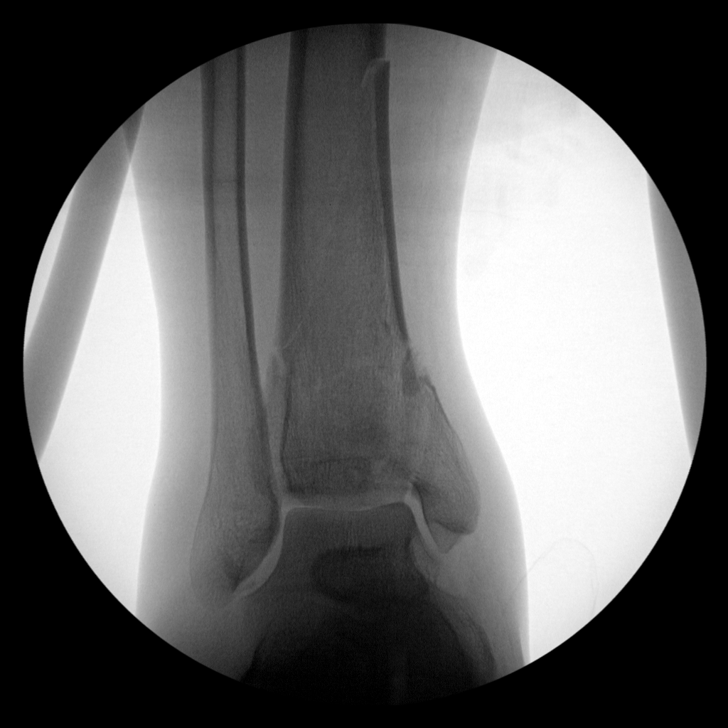
[im 3/8]
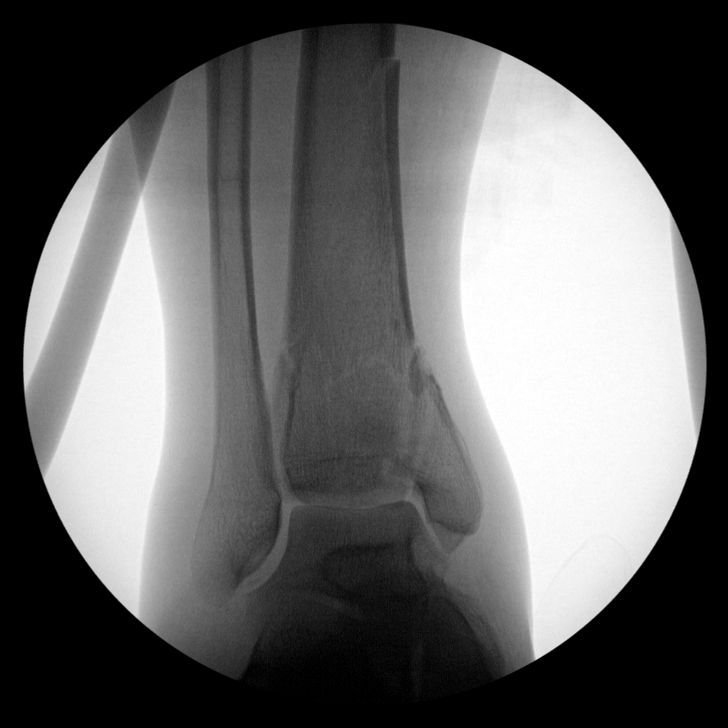
[im 4/8]
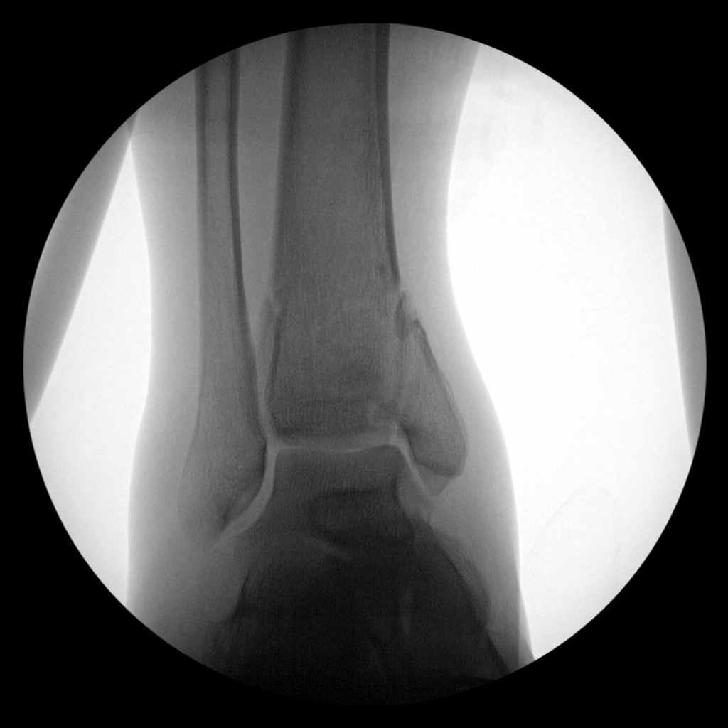
[im 5/8]
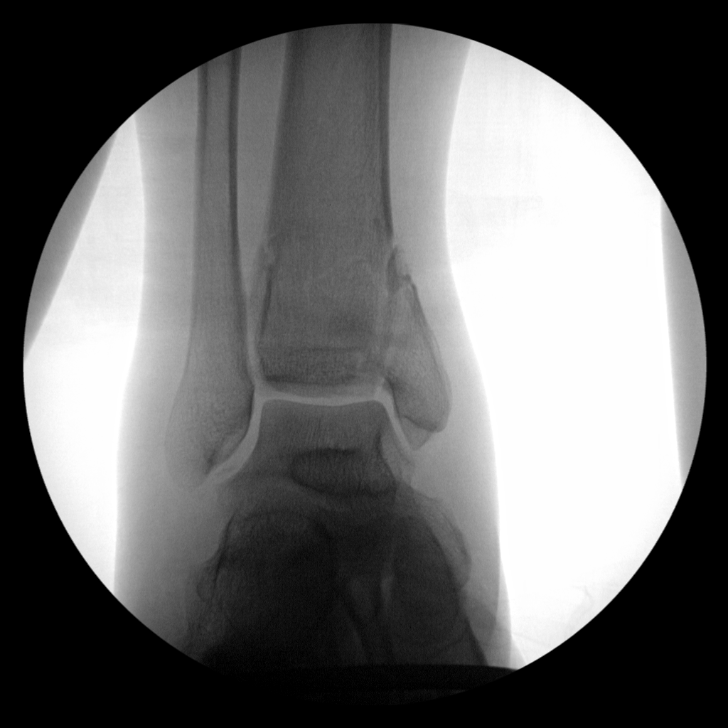
[im 6/8]
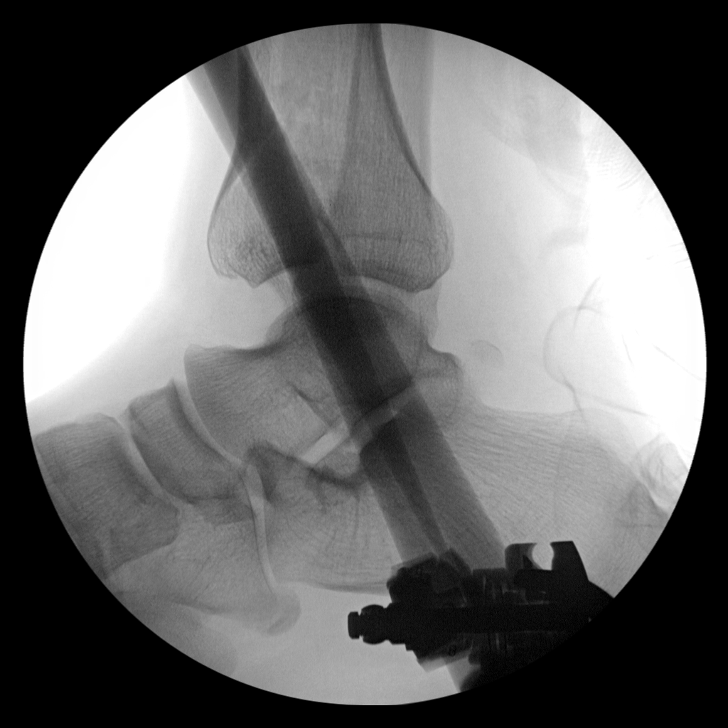
[im 7/8]
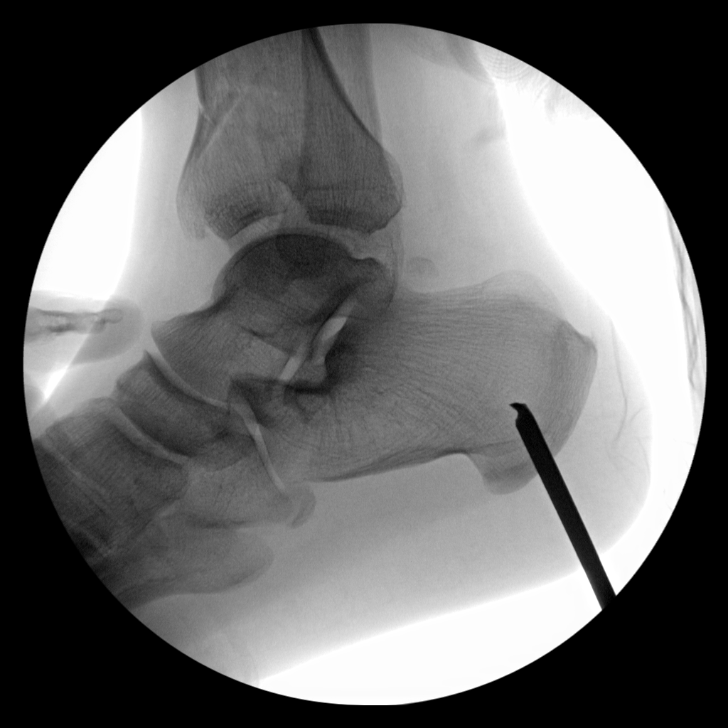
[im 8/8]
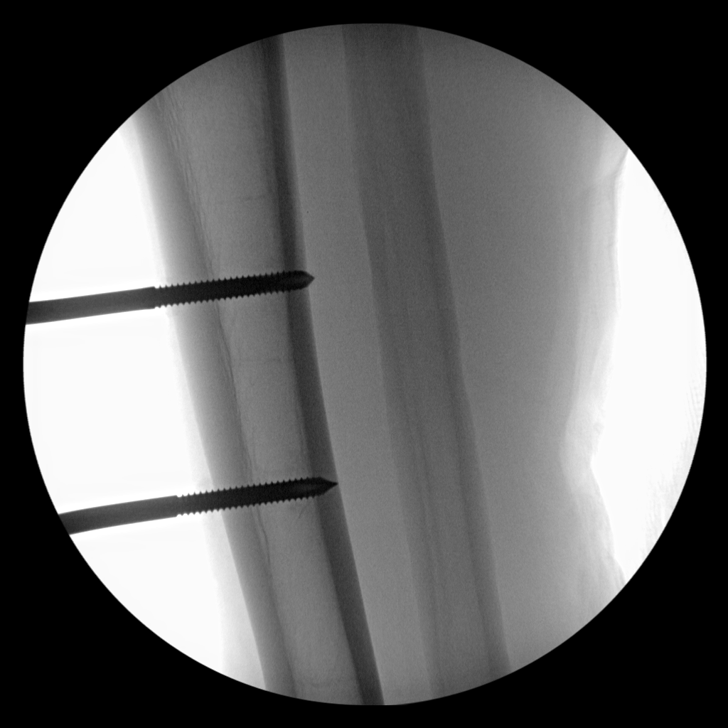

[8 of 8 positions shown; findings below may reference images not displayed]

FINDINGS: The comminuted distal tibial fracture is again is seen. Eight
fluoroscopic images were obtained during repair.
IMPRESSION: Eight fluoroscopic images were obtained during distal tibial
fracture repair.

## 2021-07-24 IMAGING — RF DG C-ARM 1-60 MIN
1 series · 15 of 15 positions shown · non-contrast
Comparison: 01/10/2020

CLINICAL DATA: ORIF left ankle

EXAM:
LEFT ANKLE COMPLETE - 3+ VIEW; DG C-ARM 1-60 MIN

[Series 1: run · 15 of 15 slices shown]
[im 1/15]
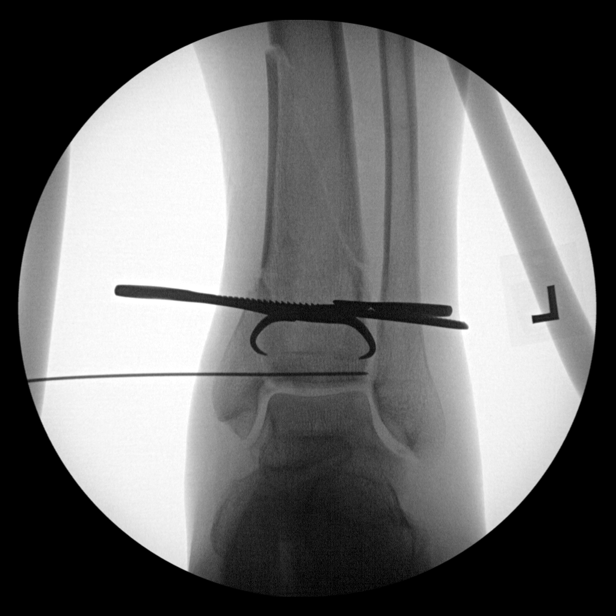
[im 2/15]
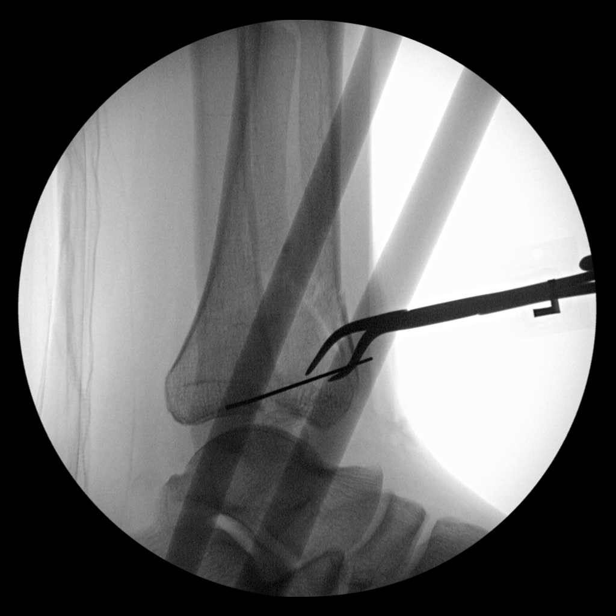
[im 3/15]
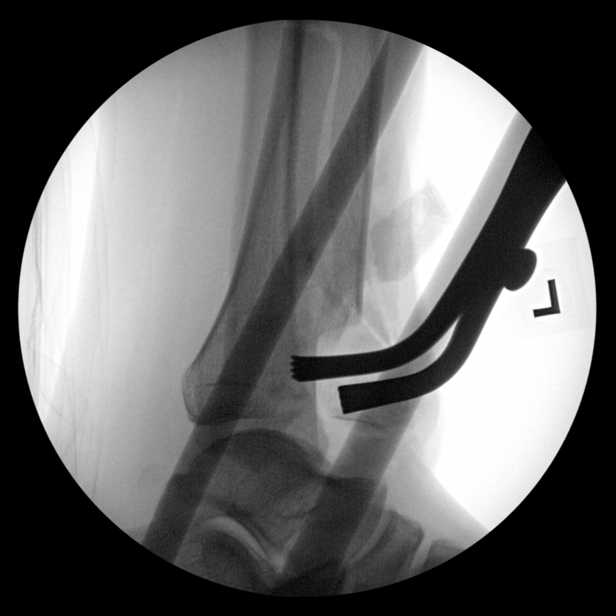
[im 4/15]
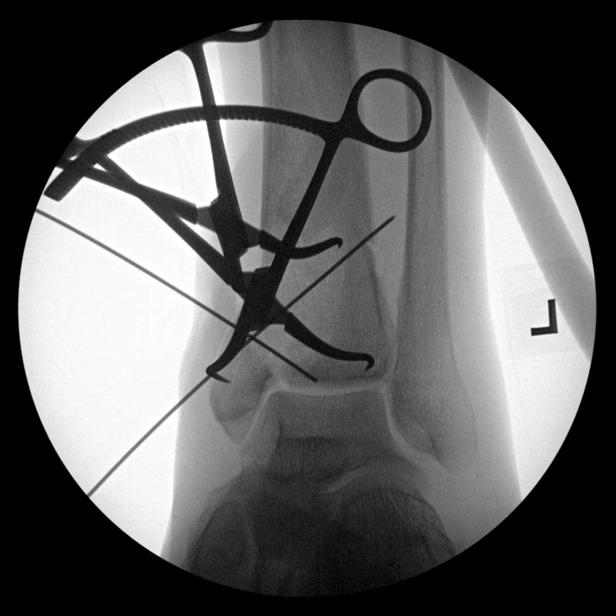
[im 5/15]
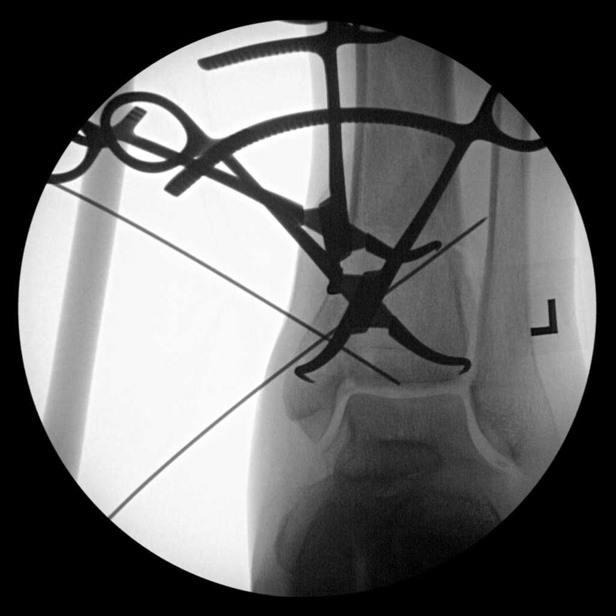
[im 6/15]
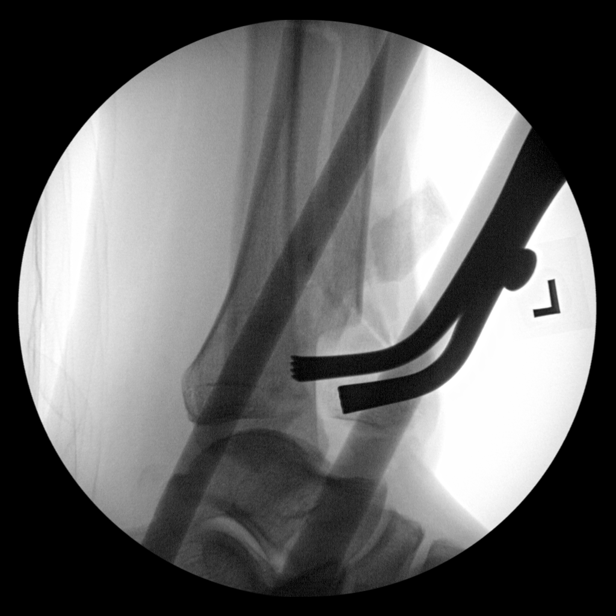
[im 7/15]
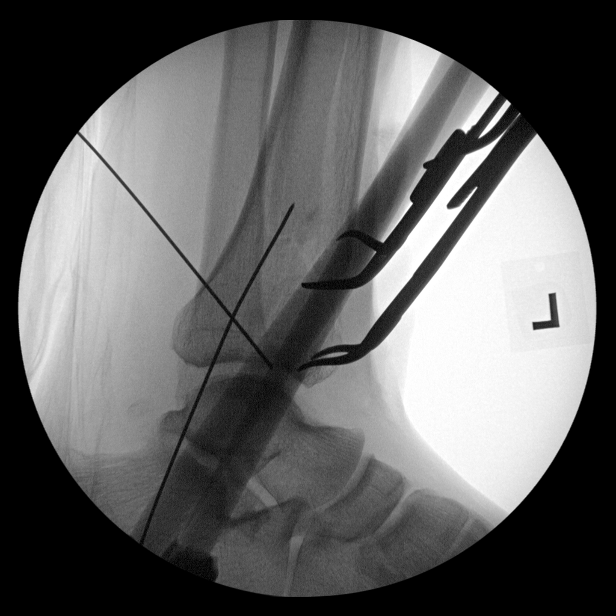
[im 8/15]
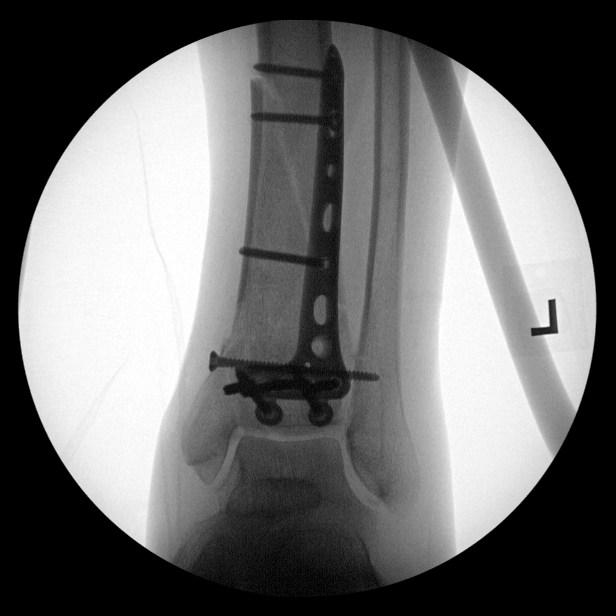
[im 9/15]
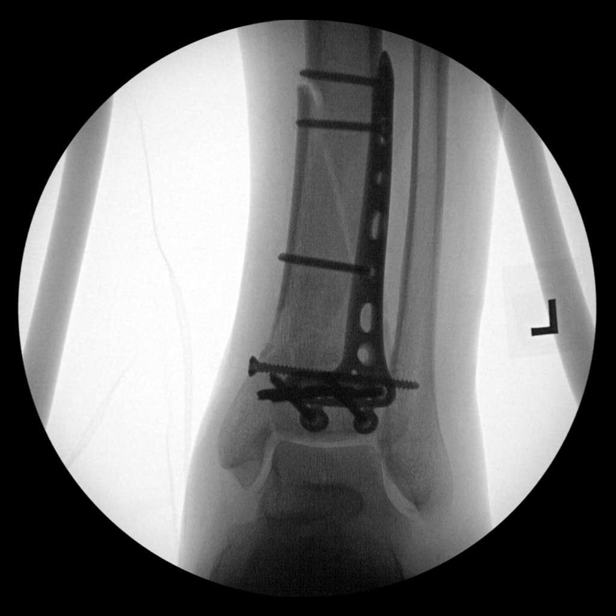
[im 10/15]
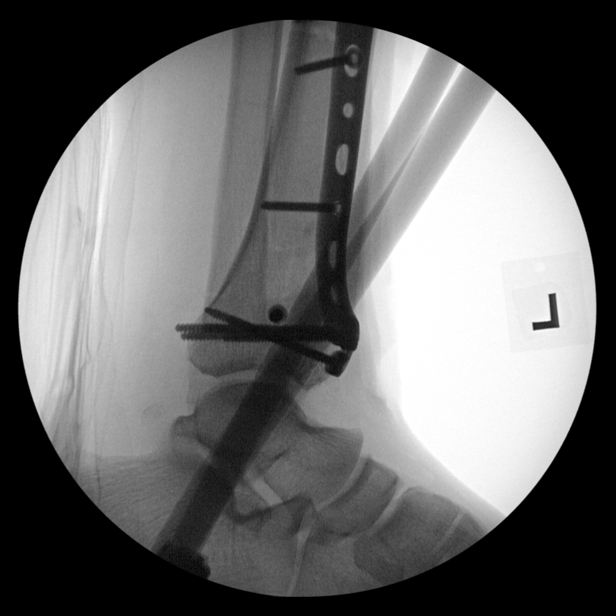
[im 11/15]
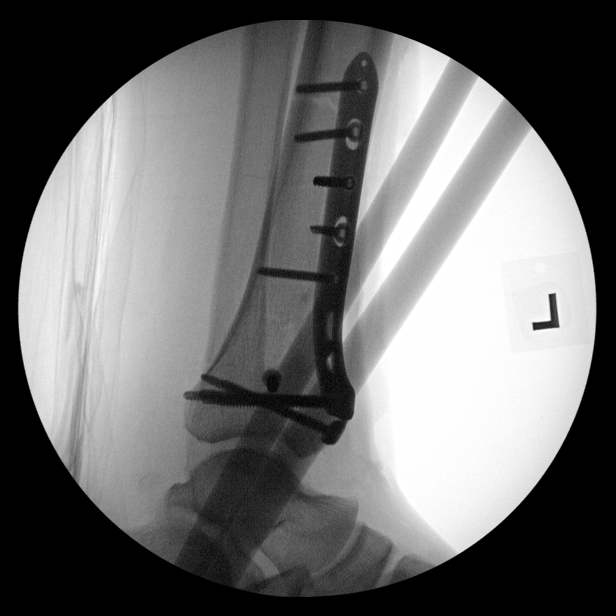
[im 12/15]
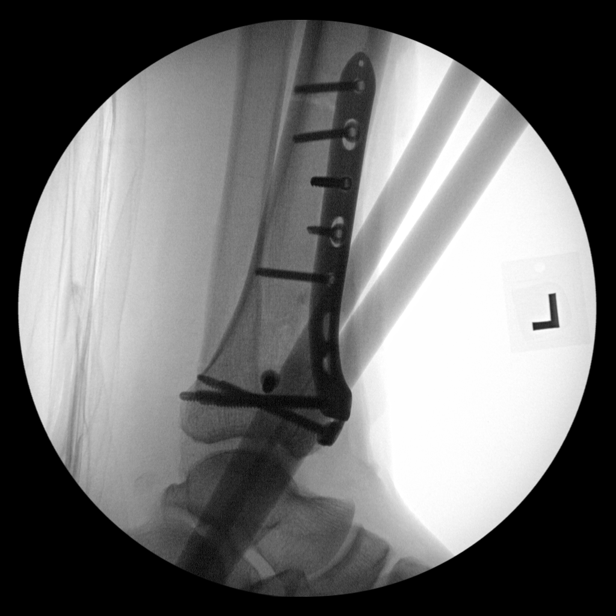
[im 13/15]
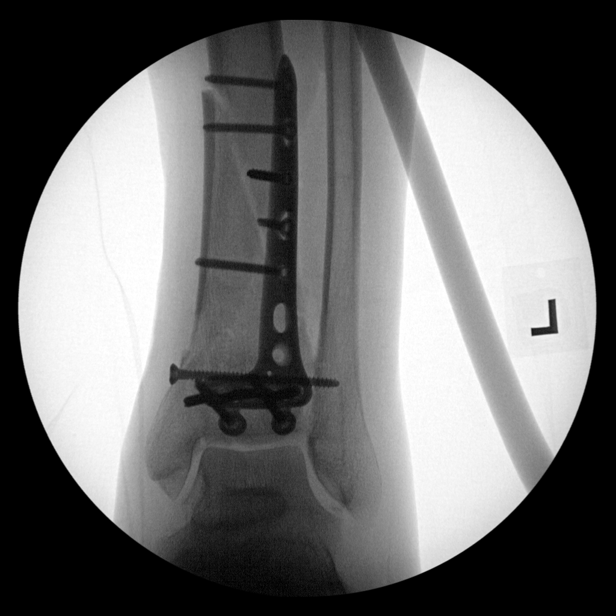
[im 14/15]
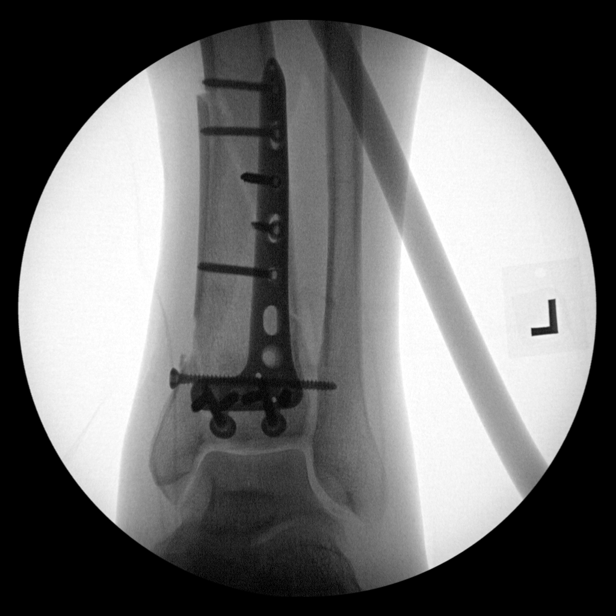
[im 15/15]
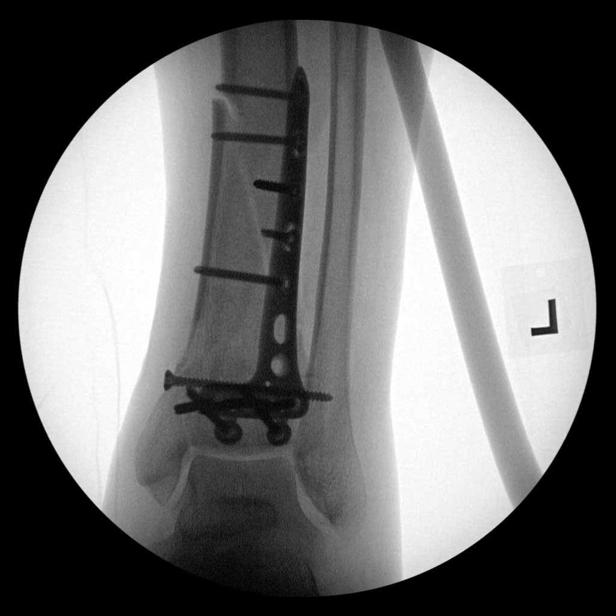

[15 of 15 positions shown; findings below may reference images not displayed]

FINDINGS: Multiple intraoperative spot images demonstrate internal fixation
across the comminuted distal tibial fracture. Near anatomic
alignment. No visible complicating feature.
IMPRESSION: Internal fixation in the distal left tibia with near anatomic
alignment.
